# Patient Record
Sex: Female | Born: 1980
Health system: Southern US, Community
[De-identification: ages and names within clinical notes are randomized; demographics above are authoritative.]

## PROBLEM LIST (undated history)

## (undated) DIAGNOSIS — O24419 Gestational diabetes mellitus in pregnancy, unspecified control: Secondary | ICD-10-CM

---

## 2003-05-07 ENCOUNTER — Emergency Department (HOSPITAL_COMMUNITY): Admission: EM | Admit: 2003-05-07 | Discharge: 2003-05-07 | Payer: Self-pay | Admitting: Emergency Medicine

## 2003-05-08 ENCOUNTER — Emergency Department (HOSPITAL_COMMUNITY): Admission: EM | Admit: 2003-05-08 | Discharge: 2003-05-08 | Payer: Self-pay | Admitting: Emergency Medicine

## 2003-12-02 ENCOUNTER — Emergency Department (HOSPITAL_COMMUNITY): Admission: EM | Admit: 2003-12-02 | Discharge: 2003-12-02 | Payer: Self-pay | Admitting: Emergency Medicine

## 2005-11-10 ENCOUNTER — Ambulatory Visit: Payer: Self-pay | Admitting: Internal Medicine

## 2005-12-01 ENCOUNTER — Emergency Department: Payer: Self-pay | Admitting: Unknown Physician Specialty

## 2006-04-09 IMAGING — US ABDOMEN ULTRASOUND
1 series · 17 of 25 positions shown · non-contrast
Comparison: none

REASON FOR EXAM: RUQ PAIN Call Report [id number]
COMMENTS:

[Series 1: abdomen ultrasound · 17 of 45 slices shown]
[im 1/45]
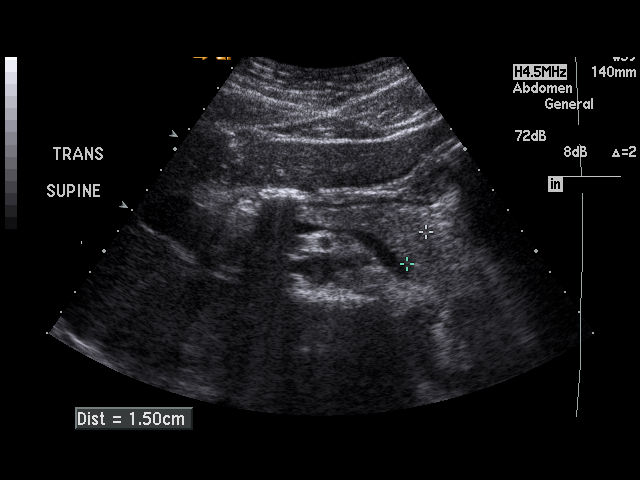
[im 4/45]
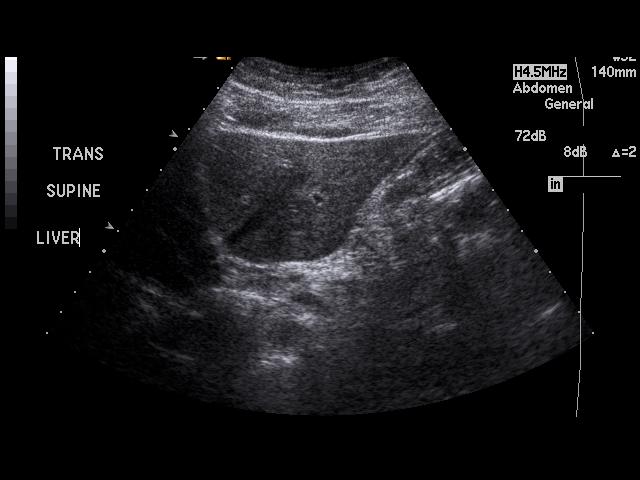
[im 6/45]
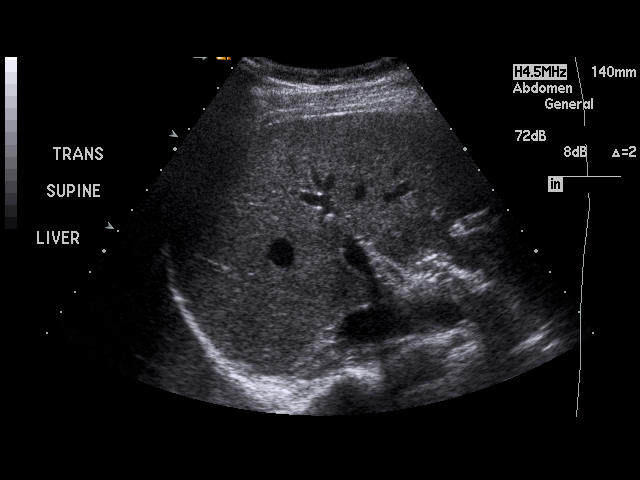
[im 10/45]
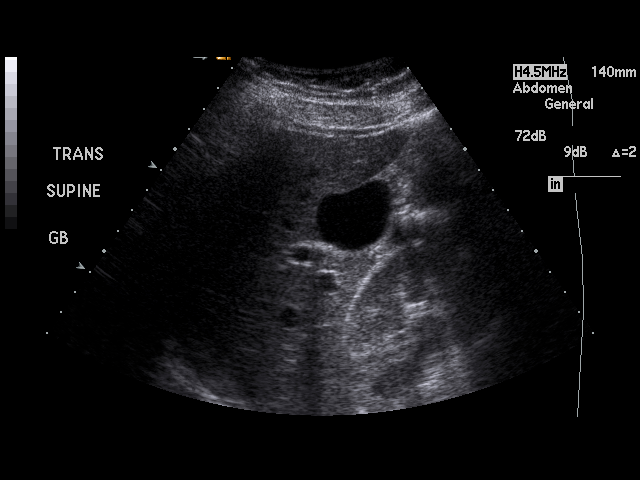
[im 12/45]
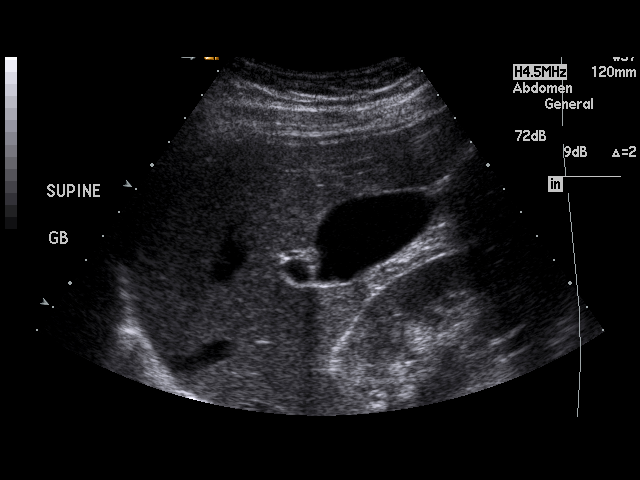
[im 15/45]
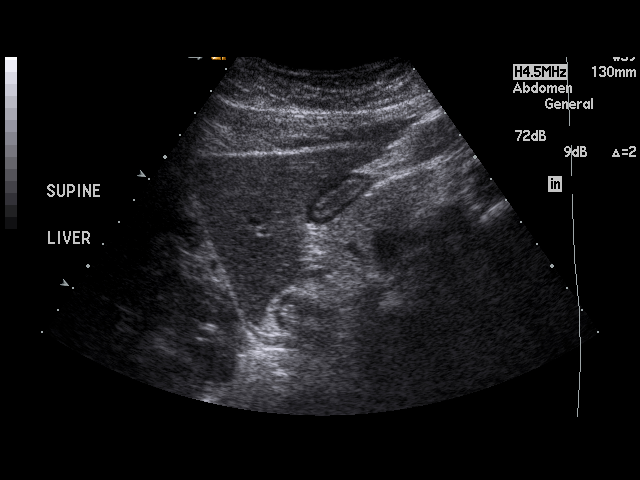
[im 17/45]
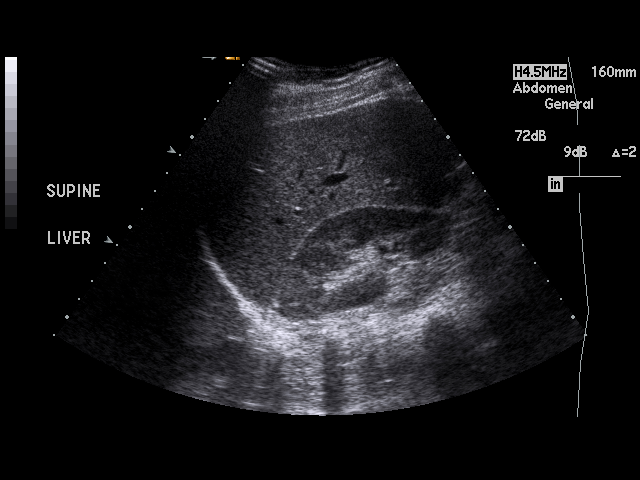
[im 21/45]
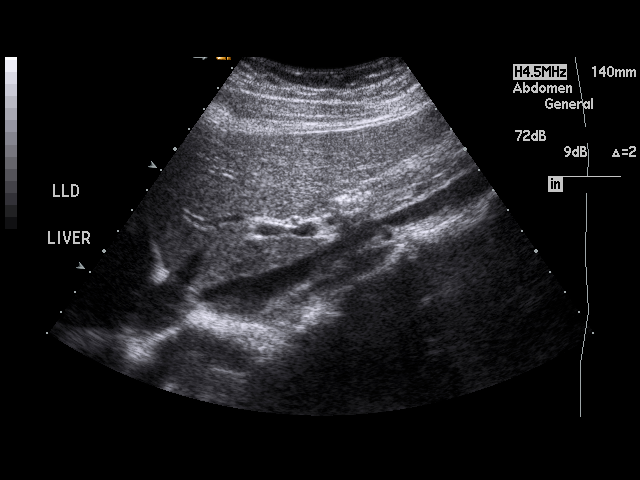
[im 23/45]
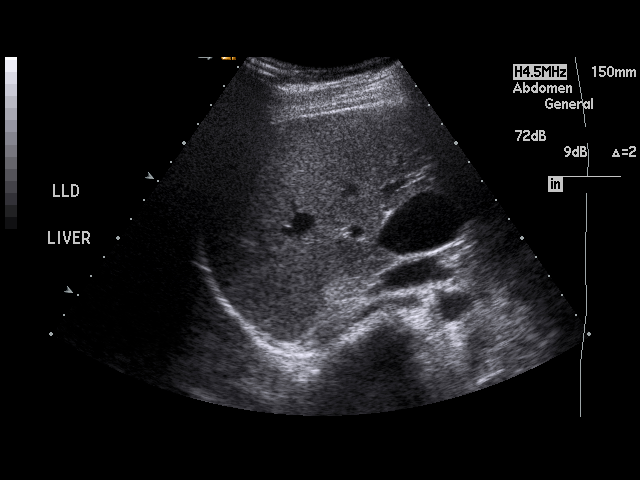
[im 24/45]
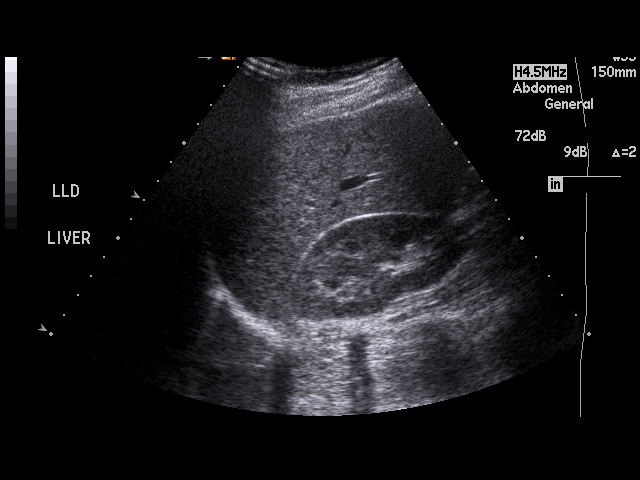
[im 28/45]
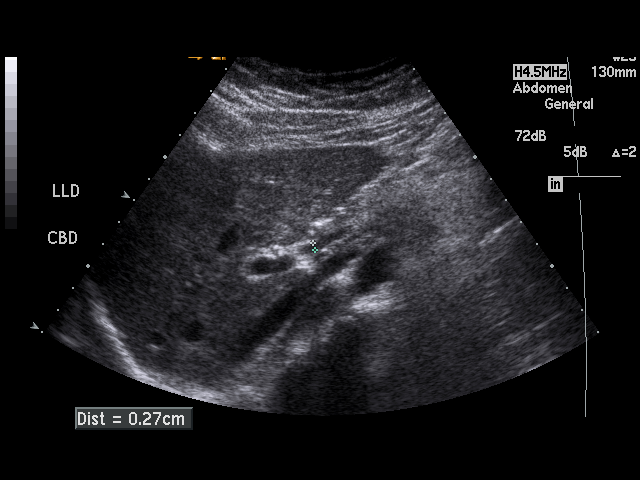
[im 30/45]
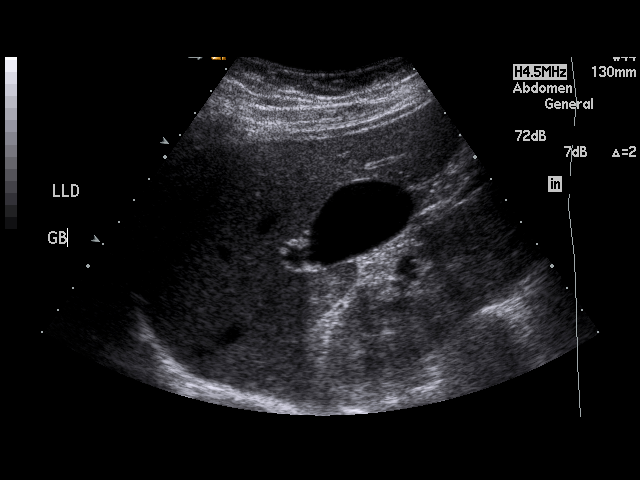
[im 34/45]
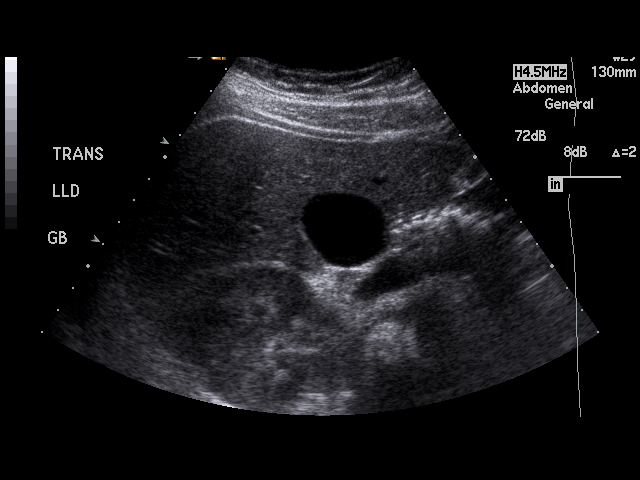
[im 35/45]
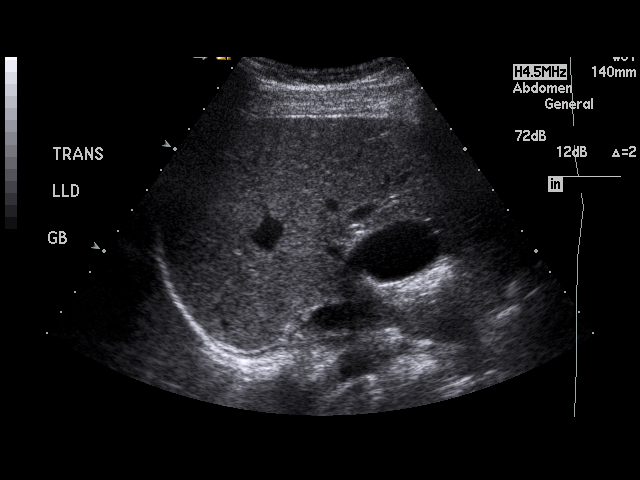
[im 39/45]
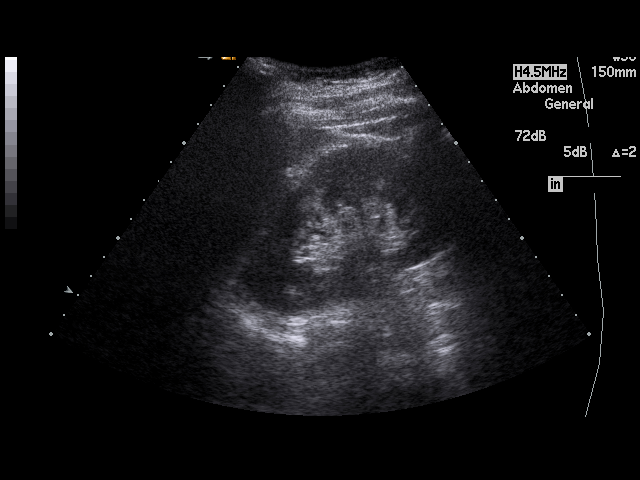
[im 41/45]
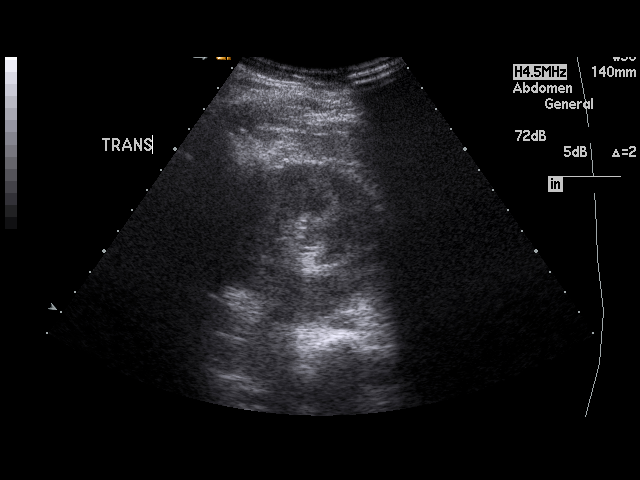
[im 45/45]
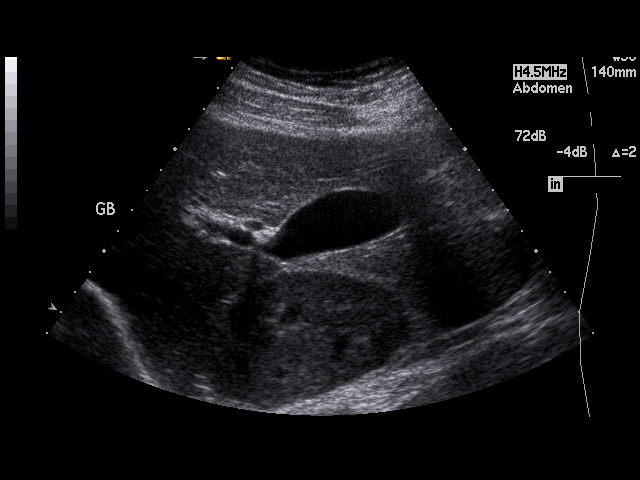

[17 of 25 positions shown; findings below may reference images not displayed]

PROCEDURE:     US  - US ABDOMEN GENERAL SURVEY  - November 10, 2005  [DATE]

RESULT:     The liver, spleen and pancreas are normal in appearance. No
gallstones are seen. There is no thickening of the gallbladder wall. The
common bile duct measures 2.8 mm in diameter, which is within normal limits.
The kidneys show no hydronephrosis. There is no ascites.
IMPRESSION: 1)No significant abnormalities are noted.

## 2009-02-17 ENCOUNTER — Ambulatory Visit: Payer: Self-pay | Admitting: Obstetrics and Gynecology

## 2009-03-18 ENCOUNTER — Observation Stay: Payer: Self-pay

## 2009-03-21 ENCOUNTER — Inpatient Hospital Stay: Payer: Self-pay

## 2013-12-15 ENCOUNTER — Emergency Department: Payer: Self-pay | Admitting: Emergency Medicine

## 2013-12-15 LAB — URINALYSIS, COMPLETE
BILIRUBIN, UR: NEGATIVE
Bacteria: NONE SEEN
GLUCOSE, UR: NEGATIVE mg/dL (ref 0–75)
KETONE: NEGATIVE
LEUKOCYTE ESTERASE: NEGATIVE
NITRITE: NEGATIVE
PH: 6 (ref 4.5–8.0)
Protein: NEGATIVE
RBC,UR: 144 /HPF (ref 0–5)
Specific Gravity: 1.01 (ref 1.003–1.030)

## 2013-12-15 LAB — COMPREHENSIVE METABOLIC PANEL
ALBUMIN: 3.6 g/dL (ref 3.4–5.0)
ALT: 49 U/L (ref 12–78)
AST: 31 U/L (ref 15–37)
Alkaline Phosphatase: 80 U/L
Anion Gap: 2 — ABNORMAL LOW (ref 7–16)
BUN: 7 mg/dL (ref 7–18)
Bilirubin,Total: 0.5 mg/dL (ref 0.2–1.0)
CREATININE: 0.71 mg/dL (ref 0.60–1.30)
Calcium, Total: 8.7 mg/dL (ref 8.5–10.1)
Chloride: 105 mmol/L (ref 98–107)
Co2: 28 mmol/L (ref 21–32)
EGFR (African American): >60
Glucose: 89 mg/dL (ref 65–99)
Osmolality: 268 (ref 275–301)
POTASSIUM: 3.6 mmol/L (ref 3.5–5.1)
Sodium: 135 mmol/L — ABNORMAL LOW (ref 136–145)
Total Protein: 7.8 g/dL (ref 6.4–8.2)

## 2013-12-15 LAB — CBC
HCT: 39 % (ref 35.0–47.0)
HGB: 12.8 g/dL (ref 12.0–16.0)
MCH: 30 pg (ref 26.0–34.0)
MCHC: 32.8 g/dL (ref 32.0–36.0)
MCV: 92 fL (ref 80–100)
PLATELETS: 335 10*3/uL (ref 150–440)
RBC: 4.26 10*6/uL (ref 3.80–5.20)
RDW: 12.9 % (ref 11.5–14.5)
WBC: 9.1 10*3/uL (ref 3.6–11.0)

## 2013-12-15 LAB — HCG, QUANTITATIVE, PREGNANCY: BETA HCG, QUANT.: 465 m[IU]/mL — AB

## 2014-02-04 LAB — OB RESULTS CONSOLE RPR: RPR: NONREACTIVE

## 2014-08-07 LAB — OB RESULTS CONSOLE HIV ANTIBODY (ROUTINE TESTING): HIV: NONREACTIVE

## 2014-08-07 LAB — OB RESULTS CONSOLE PLATELET COUNT: Platelets: 352 10*3/uL

## 2014-08-07 LAB — OB RESULTS CONSOLE GC/CHLAMYDIA
Chlamydia: NEGATIVE
GC PROBE AMP, GENITAL: NEGATIVE

## 2014-08-07 LAB — OB RESULTS CONSOLE RUBELLA ANTIBODY, IGM: RUBELLA: IMMUNE

## 2014-08-07 LAB — OB RESULTS CONSOLE VARICELLA ZOSTER ANTIBODY, IGG: Varicella: IMMUNE

## 2014-08-07 LAB — OB RESULTS CONSOLE HGB/HCT, BLOOD: Hemoglobin: 11.7 g/dL

## 2014-09-05 ENCOUNTER — Ambulatory Visit: Payer: Self-pay

## 2014-09-17 ENCOUNTER — Ambulatory Visit: Payer: Self-pay

## 2014-10-18 NOTE — L&D Delivery Note (Addendum)
Delivery Note At 6:23 PM a viable and healthy female was delivered via Vaginal, Spontaneous Delivery (Presentation: Left Occiput Anterior).  APGAR: 8, 9; weight  .   Placenta status: Intact, Spontaneous.  Cord: 3 vessels with the following complications: None.  Anesthesia: Epidural  Episiotomy: None Lacerations: None Suture Repair: n/a Est. Blood Loss (mL): 300  Boggy lower uterine segment with a firm fundus, manual evacuation of clots x2, 800mcg cytotec given rectally.  Mom to postpartum.  Baby to Couplet care / Skin to Skin.  [name], Shari Mccarthy 02/24/2015, 6:49 PM

## 2014-12-05 LAB — OB RESULTS CONSOLE HGB/HCT, BLOOD: Hemoglobin: 11 g/dL

## 2015-01-31 LAB — OB RESULTS CONSOLE GC/CHLAMYDIA
CHLAMYDIA, DNA PROBE: NEGATIVE
GC PROBE AMP, GENITAL: NEGATIVE

## 2015-01-31 LAB — OB RESULTS CONSOLE GBS: GBS: NEGATIVE

## 2015-02-13 ENCOUNTER — Observation Stay
Admit: 2015-02-13 | Disposition: A | Discharge status: Home / Self Care | Payer: Self-pay | Attending: Obstetrics and Gynecology | Admitting: Obstetrics and Gynecology

## 2015-02-13 LAB — PROTEIN / CREATININE RATIO, URINE
Creatinine, Urine Random: 74 mg/dL (ref 30–125)
PROTEIN/CREAT. RATIO: 135 mg/g{creat} (ref 0–200)
Protein, Urine: 10 mg/dL (ref 0–9)

## 2015-02-13 LAB — PIH PROFILE
Anion Gap: 9 (ref 7–16)
BUN: 11 mg/dL
CO2: 21 mmol/L — AB
Calcium, Total: 8.5 mg/dL — ABNORMAL LOW
Chloride: 106 mmol/L
Creatinine: 0.45 mg/dL
EGFR (Non-African Amer.): >60
GLUCOSE: 96 mg/dL
HCT: 36.2 % (ref 35.0–47.0)
HGB: 12.7 g/dL (ref 12.0–16.0)
MCH: 33.2 pg (ref 26.0–34.0)
MCHC: 35.2 g/dL (ref 32.0–36.0)
MCV: 94 fL (ref 80–100)
PLATELETS: 238 10*3/uL (ref 150–440)
POTASSIUM: 3.9 mmol/L
RBC: 3.84 10*6/uL (ref 3.80–5.20)
RDW: 13.9 % (ref 11.5–14.5)
SGOT(AST): 23 U/L
SODIUM: 136 mmol/L
Uric Acid: 5.1 mg/dL
WBC: 9.3 10*3/uL (ref 3.6–11.0)

## 2015-02-15 ENCOUNTER — Observation Stay
Admit: 2015-02-15 | Disposition: A | Discharge status: Home / Self Care | Payer: Self-pay | Attending: Obstetrics and Gynecology | Admitting: Obstetrics and Gynecology

## 2015-02-23 ENCOUNTER — Inpatient Hospital Stay
Admission: EM | Admit: 2015-02-23 | Discharge: 2015-02-26 | DRG: 774 | Disposition: A | Discharge status: Home / Self Care | Payer: BLUE CROSS/BLUE SHIELD | Attending: Obstetrics and Gynecology | Admitting: Obstetrics and Gynecology

## 2015-02-23 ENCOUNTER — Encounter: Payer: Self-pay | Admitting: *Deleted

## 2015-02-23 DIAGNOSIS — Z3A39 39 weeks gestation of pregnancy: Secondary | ICD-10-CM | POA: Diagnosis present

## 2015-02-23 DIAGNOSIS — O1403 Mild to moderate pre-eclampsia, third trimester: Secondary | ICD-10-CM | POA: Diagnosis present

## 2015-02-23 DIAGNOSIS — O4292 Full-term premature rupture of membranes, unspecified as to length of time between rupture and onset of labor: Principal | ICD-10-CM | POA: Diagnosis present

## 2015-02-23 DIAGNOSIS — O169 Unspecified maternal hypertension, unspecified trimester: Secondary | ICD-10-CM | POA: Diagnosis not present

## 2015-02-23 DIAGNOSIS — O24420 Gestational diabetes mellitus in childbirth, diet controlled: Secondary | ICD-10-CM | POA: Diagnosis present

## 2015-02-23 HISTORY — DX: Gestational diabetes mellitus in pregnancy, unspecified control: O24.419

## 2015-02-24 ENCOUNTER — Encounter: Payer: Self-pay | Admitting: Obstetrics and Gynecology

## 2015-02-24 ENCOUNTER — Inpatient Hospital Stay: Payer: BLUE CROSS/BLUE SHIELD | Admitting: Registered Nurse

## 2015-02-24 ENCOUNTER — Other Ambulatory Visit: Payer: Self-pay | Admitting: Obstetrics and Gynecology

## 2015-02-24 DIAGNOSIS — O24420 Gestational diabetes mellitus in childbirth, diet controlled: Secondary | ICD-10-CM | POA: Diagnosis present

## 2015-02-24 DIAGNOSIS — O1403 Mild to moderate pre-eclampsia, third trimester: Secondary | ICD-10-CM | POA: Diagnosis present

## 2015-02-24 DIAGNOSIS — O169 Unspecified maternal hypertension, unspecified trimester: Secondary | ICD-10-CM | POA: Diagnosis not present

## 2015-02-24 DIAGNOSIS — O4292 Full-term premature rupture of membranes, unspecified as to length of time between rupture and onset of labor: Secondary | ICD-10-CM | POA: Diagnosis present

## 2015-02-24 DIAGNOSIS — Z3A39 39 weeks gestation of pregnancy: Secondary | ICD-10-CM | POA: Diagnosis present

## 2015-02-24 LAB — CBC
HEMATOCRIT: 38.4 % (ref 35.0–47.0)
HEMOGLOBIN: 13.1 g/dL (ref 12.0–16.0)
MCH: 32.6 pg (ref 26.0–34.0)
MCHC: 34.1 g/dL (ref 32.0–36.0)
MCV: 95.6 fL (ref 80.0–100.0)
Platelets: 247 10*3/uL (ref 150–440)
RBC: 4.02 MIL/uL (ref 3.80–5.20)
RDW: 13.7 % (ref 11.5–14.5)
WBC: 9.7 10*3/uL (ref 3.6–11.0)

## 2015-02-24 LAB — COMPREHENSIVE METABOLIC PANEL
ALBUMIN: 2.9 g/dL — AB (ref 3.5–5.0)
ALT: 17 U/L (ref 14–54)
ANION GAP: 9 (ref 5–15)
AST: 30 U/L (ref 15–41)
Alkaline Phosphatase: 207 U/L — ABNORMAL HIGH (ref 38–126)
BILIRUBIN TOTAL: 0.8 mg/dL (ref 0.3–1.2)
BUN: 9 mg/dL (ref 6–20)
CALCIUM: 8.5 mg/dL — AB (ref 8.9–10.3)
CHLORIDE: 106 mmol/L (ref 101–111)
CO2: 22 mmol/L (ref 22–32)
CREATININE: 0.47 mg/dL (ref 0.44–1.00)
Glucose, Bld: 75 mg/dL (ref 65–99)
Potassium: 4.3 mmol/L (ref 3.5–5.1)
Sodium: 137 mmol/L (ref 135–145)
TOTAL PROTEIN: 6.5 g/dL (ref 6.5–8.1)

## 2015-02-24 LAB — ABO/RH: ABO/RH(D): A POS

## 2015-02-24 LAB — URIC ACID: Uric Acid, Serum: 6.5 mg/dL (ref 2.3–6.6)

## 2015-02-24 LAB — TYPE AND SCREEN
ABO/RH(D): A POS
Antibody Screen: NEGATIVE

## 2015-02-24 MED ORDER — LACTATED RINGERS IV SOLN: 500.0000 mL | INTRAVENOUS | Status: DC | PRN

## 2015-02-24 MED ORDER — DIPHENHYDRAMINE HCL 25 MG PO CAPS: 25.0000 mg | ORAL_CAPSULE | Freq: Four times a day (QID) | ORAL | Status: DC | PRN

## 2015-02-24 MED ORDER — DIBUCAINE 1 % RE OINT: 1.0000 "application " | TOPICAL_OINTMENT | RECTAL | Status: DC | PRN

## 2015-02-24 MED ORDER — PRENATAL VITAMINS 0.8 MG PO TABS
1.0000 | ORAL_TABLET | Freq: Every day | ORAL | Status: DC
  Filled 2015-02-24 (×2): qty 1

## 2015-02-24 MED ORDER — OXYTOCIN 10 UNIT/ML IJ SOLN
INTRAMUSCULAR | Status: AC
  Filled 2015-02-24: qty 2

## 2015-02-24 MED ORDER — IBUPROFEN 600 MG PO TABS
ORAL_TABLET | ORAL | Status: AC
  Administered 2015-02-24: 600 mg via ORAL
  Filled 2015-02-24: qty 1

## 2015-02-24 MED ORDER — MEASLES, MUMPS & RUBELLA VAC ~~LOC~~ INJ
0.5000 mL | INJECTION | Freq: Once | SUBCUTANEOUS | Status: DC
  Filled 2015-02-24: qty 0.5

## 2015-02-24 MED ORDER — OXYTOCIN 40 UNITS IN LACTATED RINGERS INFUSION - SIMPLE MED
1.0000 m[IU]/min | INTRAVENOUS | Status: DC
  Administered 2015-02-24: 13 m[IU]/min via INTRAVENOUS
  Administered 2015-02-24: 1 m[IU]/min via INTRAVENOUS
  Administered 2015-02-24: 19 m[IU]/min via INTRAVENOUS

## 2015-02-24 MED ORDER — OXYTOCIN BOLUS FROM INFUSION
500.0000 mL | INTRAVENOUS | Status: DC
  Administered 2015-02-24: 500 mL via INTRAVENOUS

## 2015-02-24 MED ORDER — ACETAMINOPHEN 325 MG PO TABS: 650.0000 mg | ORAL_TABLET | ORAL | Status: DC | PRN

## 2015-02-24 MED ORDER — SENNOSIDES-DOCUSATE SODIUM 8.6-50 MG PO TABS
2.0000 | ORAL_TABLET | ORAL | Status: DC
  Administered 2015-02-25 – 2015-02-26 (×2): 2 via ORAL
  Filled 2015-02-24 (×4): qty 2

## 2015-02-24 MED ORDER — FENTANYL 2.5 MCG/ML W/ROPIVACAINE 0.2% IN NS 100 ML EPIDURAL INFUSION (ARMC-ANES)
EPIDURAL | Status: AC
  Administered 2015-02-24: 250 ug
  Filled 2015-02-24: qty 100

## 2015-02-24 MED ORDER — SODIUM CHLORIDE 0.9 % IV SOLN: 250.0000 mL | INTRAVENOUS | Status: DC | PRN

## 2015-02-24 MED ORDER — BUPIVACAINE HCL (PF) 0.25 % IJ SOLN
INTRAMUSCULAR | Status: DC | PRN
  Administered 2015-02-24 (×2): 4 mL

## 2015-02-24 MED ORDER — WITCH HAZEL-GLYCERIN EX PADS: 1.0000 "application " | MEDICATED_PAD | CUTANEOUS | Status: DC | PRN

## 2015-02-24 MED ORDER — SODIUM CHLORIDE 0.9 % IJ SOLN: 3.0000 mL | Freq: Two times a day (BID) | INTRAMUSCULAR | Status: DC

## 2015-02-24 MED ORDER — SIMETHICONE 80 MG PO CHEW
80.0000 mg | CHEWABLE_TABLET | ORAL | Status: DC | PRN
  Filled 2015-02-24: qty 1

## 2015-02-24 MED ORDER — MISOPROSTOL 200 MCG PO TABS
ORAL_TABLET | ORAL | Status: AC
  Filled 2015-02-24: qty 4

## 2015-02-24 MED ORDER — LACTATED RINGERS IV SOLN
INTRAVENOUS | Status: DC
  Administered 2015-02-24: 125 mL/h via INTRAUTERINE

## 2015-02-24 MED ORDER — EPHEDRINE 5 MG/ML INJ
10.0000 mg | INTRAVENOUS | Status: DC | PRN
  Filled 2015-02-24: qty 2

## 2015-02-24 MED ORDER — BENZOCAINE-MENTHOL 20-0.5 % EX AERO: 1.0000 "application " | INHALATION_SPRAY | CUTANEOUS | Status: DC | PRN

## 2015-02-24 MED ORDER — TERBUTALINE SULFATE 1 MG/ML IJ SOLN
0.2500 mg | Freq: Once | INTRAMUSCULAR | Status: AC | PRN
  Administered 2015-02-24: 0.25 mg via SUBCUTANEOUS

## 2015-02-24 MED ORDER — ONDANSETRON HCL 4 MG/2ML IJ SOLN: 4.0000 mg | INTRAMUSCULAR | Status: DC | PRN

## 2015-02-24 MED ORDER — LACTATED RINGERS IV SOLN
INTRAVENOUS | Status: DC
  Administered 2015-02-24 (×3): via INTRAVENOUS
  Administered 2015-02-24 (×2): 125 mL/h via INTRAVENOUS

## 2015-02-24 MED ORDER — LANOLIN HYDROUS EX OINT: TOPICAL_OINTMENT | CUTANEOUS | Status: DC | PRN

## 2015-02-24 MED ORDER — OXYCODONE-ACETAMINOPHEN 5-325 MG PO TABS
1.0000 | ORAL_TABLET | ORAL | Status: DC | PRN
  Administered 2015-02-25: 1 via ORAL
  Filled 2015-02-24: qty 1

## 2015-02-24 MED ORDER — PHENYLEPHRINE 40 MCG/ML (10ML) SYRINGE FOR IV PUSH (FOR BLOOD PRESSURE SUPPORT)
80.0000 ug | PREFILLED_SYRINGE | INTRAVENOUS | Status: DC | PRN
  Filled 2015-02-24: qty 2

## 2015-02-24 MED ORDER — OXYCODONE-ACETAMINOPHEN 5-325 MG PO TABS: 1.0000 | ORAL_TABLET | ORAL | Status: DC | PRN

## 2015-02-24 MED ORDER — OXYCODONE-ACETAMINOPHEN 5-325 MG PO TABS: 2.0000 | ORAL_TABLET | ORAL | Status: DC | PRN

## 2015-02-24 MED ORDER — CITRIC ACID-SODIUM CITRATE 334-500 MG/5ML PO SOLN: 30.0000 mL | ORAL | Status: DC | PRN

## 2015-02-24 MED ORDER — BUTORPHANOL TARTRATE 1 MG/ML IJ SOLN: 1.0000 mg | INTRAMUSCULAR | Status: DC | PRN

## 2015-02-24 MED ORDER — LIDOCAINE HCL (PF) 1 % IJ SOLN: INTRAMUSCULAR | Status: DC | PRN

## 2015-02-24 MED ORDER — LIDOCAINE HCL (PF) 1 % IJ SOLN
INTRAMUSCULAR | Status: AC
  Filled 2015-02-24: qty 30

## 2015-02-24 MED ORDER — LIDOCAINE-EPINEPHRINE (PF) 1.5 %-1:200000 IJ SOLN
INTRAMUSCULAR | Status: DC | PRN
  Administered 2015-02-24: 3 mL

## 2015-02-24 MED ORDER — ONDANSETRON HCL 4 MG/2ML IJ SOLN: 4.0000 mg | Freq: Four times a day (QID) | INTRAMUSCULAR | Status: DC | PRN

## 2015-02-24 MED ORDER — DIPHENHYDRAMINE HCL 50 MG/ML IJ SOLN: 12.5000 mg | INTRAMUSCULAR | Status: DC | PRN

## 2015-02-24 MED ORDER — LIDOCAINE HCL (PF) 1 % IJ SOLN: 30.0000 mL | INTRAMUSCULAR | Status: DC | PRN

## 2015-02-24 MED ORDER — FENTANYL 2.5 MCG/ML W/ROPIVACAINE 0.2% IN NS 100 ML EPIDURAL INFUSION (ARMC-ANES): 9.0000 mL/h | EPIDURAL | Status: DC

## 2015-02-24 MED ORDER — FLEET ENEMA 7-19 GM/118ML RE ENEM: 1.0000 | ENEMA | Freq: Every day | RECTAL | Status: DC | PRN

## 2015-02-24 MED ORDER — TETANUS-DIPHTH-ACELL PERTUSSIS 5-2.5-18.5 LF-MCG/0.5 IM SUSP
0.5000 mL | Freq: Once | INTRAMUSCULAR | Status: DC
  Filled 2015-02-24: qty 0.5

## 2015-02-24 MED ORDER — OXYTOCIN 40 UNITS IN LACTATED RINGERS INFUSION - SIMPLE MED
INTRAVENOUS | Status: AC
  Filled 2015-02-24: qty 1000

## 2015-02-24 MED ORDER — TERBUTALINE SULFATE 1 MG/ML IJ SOLN
INTRAMUSCULAR | Status: AC
  Administered 2015-02-24: 0.25 mg via SUBCUTANEOUS
  Filled 2015-02-24: qty 1

## 2015-02-24 MED ORDER — IBUPROFEN 600 MG PO TABS
600.0000 mg | ORAL_TABLET | Freq: Four times a day (QID) | ORAL | Status: DC
  Administered 2015-02-24 – 2015-02-26 (×7): 600 mg via ORAL
  Filled 2015-02-24 (×6): qty 1

## 2015-02-24 MED ORDER — AMMONIA AROMATIC IN INHA
RESPIRATORY_TRACT | Status: AC
  Filled 2015-02-24: qty 10

## 2015-02-24 MED ORDER — LACTATED RINGERS IV BOLUS (SEPSIS)
350.0000 mL | Freq: Once | INTRAVENOUS | Status: AC
  Administered 2015-02-24: 350 mL via INTRAVENOUS

## 2015-02-24 MED ORDER — ONDANSETRON HCL 4 MG PO TABS
4.0000 mg | ORAL_TABLET | ORAL | Status: DC | PRN
  Filled 2015-02-24: qty 1

## 2015-02-24 MED ORDER — OXYTOCIN 40 UNITS IN LACTATED RINGERS INFUSION - SIMPLE MED: 62.5000 mL/h | INTRAVENOUS | Status: DC | PRN

## 2015-02-24 MED ORDER — BISACODYL 10 MG RE SUPP: 10.0000 mg | Freq: Every day | RECTAL | Status: DC | PRN

## 2015-02-24 MED ORDER — OXYTOCIN 40 UNITS IN LACTATED RINGERS INFUSION - SIMPLE MED: 62.5000 mL/h | INTRAVENOUS | Status: DC

## 2015-02-24 MED ORDER — MISOPROSTOL 200 MCG PO TABS
800.0000 ug | ORAL_TABLET | Freq: Once | ORAL | Status: AC
  Administered 2015-02-24: 800 ug via RECTAL

## 2015-02-24 MED ORDER — SODIUM CHLORIDE 0.9 % IJ SOLN: 3.0000 mL | INTRAMUSCULAR | Status: DC | PRN

## 2015-02-24 MED ORDER — FENTANYL 2.5 MCG/ML W/ROPIVACAINE 0.2% IN NS 100 ML EPIDURAL INFUSION (ARMC-ANES)
EPIDURAL | Status: DC | PRN
  Administered 2015-02-24: 9 mL/h via EPIDURAL

## 2015-02-24 NOTE — Progress Notes (Signed)
End of shift report given to [employer] Rxliz sedgwick rn

## 2015-02-24 NOTE — Anesthesia Procedure Notes (Signed)
Epidural Patient location during procedure: OB Start time: 02/24/2015 10:32 AM End time: 02/24/2015 10:42 AM  Staffing Resident/CRNA: Stormy FabianURTIS, Zaid Tomes  Preanesthetic Checklist Completed: patient identified, site marked, surgical consent, pre-op evaluation, timeout performed, IV checked, risks and benefits discussed and monitors and equipment checked  Epidural Patient position: sitting Prep: Betadine Patient monitoring: heart rate, continuous pulse ox and blood pressure Approach: midline Location: L4-L5 Injection technique: LOR air  Needle:  Needle type: Tuohy  Needle gauge: 18 G Needle length: 9 cm and 9 Needle insertion depth: 7 cm Catheter type: closed end flexible Catheter size: 20 Guage Catheter at skin depth: 11 cm Test dose: negative and 1.5% lidocaine with Epi 1:200 K  Assessment Sensory level: T10 Events: blood not aspirated, injection not painful, no injection resistance, negative IV test and no paresthesia  Additional Notes Pt's history reviewed and consent obtained as per OB consent Patient tolerated the insertion well without complications. Negative SATD, negative IVTD All VSS were obtained and monitored through OBIX and nursing protocols followed.Reason for block:procedure for pain

## 2015-02-24 NOTE — Progress Notes (Deleted)
Delivery Summary for Shari Mccarthy  Labor Events:   Preterm labor:   Rupture date:   Rupture time:   Rupture type: Spontaneous  Fluid Color: Clear  Induction:   Augmentation:   Complications:   Cervical ripening:          Delivery:   Episiotomy:   Lacerations:   Repair suture:   Repair # of packets:   Blood loss (ml):    This patient has no babies on file.

## 2015-02-24 NOTE — Progress Notes (Signed)
S: Pt experienced prolonged decel to 90s with tachysystole x6 minutes. Patient repositioned, O2 applied, fluid bolus, pitocin held. FSE applied. Hands and knees  O: Filed Vitals:   02/24/15 1220 02/24/15 1232 02/24/15 1332 02/24/15 1432  BP:  135/75 126/82 143/83  Pulse:  74 79 76  Temp:   98.3 F (36.8 C)   TempSrc:   Oral   Resp:      Height:      Weight:      SpO2: 100%        Gen: NAD, AAOx3      Abd: FNTTP      Ext: Non-tender, +edema    FHT:  mod var + accelerations, + decelerations as above TOCO: Q3 min SVE: 8/8/-1, edematous lip on right Pitocin ta 17   A/P:  34 y.o. yo [id number] at 3169w5d for iol/PROM with prolonged decel. Good resolution with return to baseline. Will monitor for 30 min of good strip prior to starting titration of pitocin again.  [name], Tariq Pernell 2:49 PM

## 2015-02-24 NOTE — Progress Notes (Signed)
S: Resting comfortably with epidural. + CTX, no LOF, VB O: Filed Vitals:   02/24/15 1541 02/24/15 1609 02/24/15 1632 02/24/15 1716  BP:   138/80   Pulse:   84   Temp: 99.6 F (37.6 C) 100.1 F (37.8 C)  99.1 F (37.3 C)  TempSrc: Oral Oral  Oral  Resp:      Height:      Weight:      SpO2:         Gen: NAD, AAOx3      Abd: FNTTP      Ext: Non-tender,     FHT:  mod var + accelerations no decelerations TOCO: Q3 min SVE: 10/100/+1   A/P:  34 y.o. yo [id number] at 1880w5d for iol/PROM, now fully dilated.   FWB: Reassuring Cat 1 tracing. EFW 3700  Anticipate vaginal delivery. Will begin pushing.  Elevated BP in mild range. Labs all within normal limits.   Fetal strip reassuring  Contraception: LARCs vs permanent sterilization   Shari Mccarthy 5:56 PM

## 2015-02-24 NOTE — Anesthesia Preprocedure Evaluation (Signed)
Anesthesia Evaluation  Patient identified by MRN, date of birth, ID band Patient awake    Reviewed: Allergy & Precautions, H&P , NPO status , Patient's Chart, lab work & pertinent test results  Airway Mallampati: II  TM Distance: >3 FB Neck ROM: full    Dental no notable dental hx. (+) Teeth Intact   Pulmonary neg pulmonary ROS, former smoker,  Quit smoking 6 years ago   Pulmonary exam normal       Cardiovascular negative cardio ROS Normal cardiovascular exam    Neuro/Psych negative neurological ROS  negative psych ROS   GI/Hepatic negative GI ROS, Neg liver ROS,   Endo/Other  diabetes  Renal/GU   negative genitourinary   Musculoskeletal   Abdominal   Peds  Hematology negative hematology ROS (+)   Anesthesia Other Findings   Reproductive/Obstetrics (+) Pregnancy                             Anesthesia Physical Anesthesia Plan  ASA: II  Anesthesia Plan: Epidural   Post-op Pain Management:    Induction:   Airway Management Planned:   Additional Equipment:   Intra-op Plan:   Post-operative Plan:   Informed Consent: I have reviewed the patients History and Physical, chart, labs and discussed the procedure including the risks, benefits and alternatives for the proposed anesthesia with the patient or authorized representative who has indicated his/her understanding and acceptance.     Plan Discussed with: Anesthesiologist  Anesthesia Plan Comments:         Anesthesia Quick Evaluation

## 2015-02-24 NOTE — H&P (Addendum)
Shari Mccarthy is a 34 y.o. female presenting with rupture of membranes at 6:00 pm, good fetal movement . Maternal Medical History:  Reason for admission: Rupture of membranes.  Nausea.  Contractions: Frequency: irregular.   Perceived severity is mild.    Fetal activity: Perceived fetal activity is normal.   Last perceived fetal movement was within the past hour.    Prenatal Complications - Diabetes: gestational. Diabetes is managed by diet.      OB History    Gravida Para Term Preterm AB TAB SAB Ectopic Multiple Living   4 2 2  0 1 0 1 0 0 2     History reviewed. No pertinent past medical history. History reviewed. No pertinent past surgical history. Family History: family history is not on file. Social History:  has no tobacco, alcohol, and drug history on file.   Prenatal Transfer Tool  Maternal Diabetes: Yes:  Diabetes Type:  Diet controlled Genetic Screening: Declined Maternal Ultrasounds/Referrals: Normal Fetal Ultrasounds or other Referrals:  None Maternal Substance Abuse:  No Significant Maternal Medications:  Meds include: Other:  Significant Maternal Lab Results:  Lab values include: Group B Strep negative, Other: RH positive Other Comments:  Desires breastfeeding  Last u/s at 36wks- EFW 48%, fundal placenta  Review of Systems  Constitutional: Negative for fever and chills.  Eyes: Negative for blurred vision and double vision.  Respiratory: Negative for shortness of breath.   Cardiovascular: Negative for chest pain and palpitations.  Gastrointestinal: Positive for heartburn. Negative for nausea, vomiting, abdominal pain, diarrhea and constipation.  Genitourinary: Negative for dysuria, urgency, frequency and flank pain.  Neurological: Negative for headaches.  Psychiatric/Behavioral: Negative for depression.    Dilation: 6 Effacement (%): 70 Station: -1 Exam by:: B.Vale Peraza, MD Blood pressure 137/86, pulse 90, temperature 98.6 F (37 C), temperature  source Oral, resp. rate 18, height 5' (1.524 m), weight 88.451 kg (195 lb). Maternal Exam:  Uterine Assessment: Contraction strength is mild.  Abdomen: Patient reports no abdominal tenderness. Estimated fetal weight is 3700.   Fetal presentation: vertex  Introitus: Normal vulva. Vagina is positive for vaginal discharge.  Ferning test: positive.  Nitrazine test: positive. Amniotic fluid character: clear.  Pelvis: adequate for delivery.   Cervix: Cervix evaluated by sterile speculum exam and digital exam.     Fetal Exam Fetal Monitor Review: Mode: ultrasound.   Baseline rate: 140.  Variability: moderate (6-25 bpm).   Pattern: accelerations present and no decelerations.    Fetal State Assessment: Category I - tracings are normal.     Physical Exam  Constitutional: She is oriented to person, place, and time. She appears well-developed and well-nourished. No distress.  Eyes: No scleral icterus.  Neck: Normal range of motion. Neck supple.  Cardiovascular: Normal rate.   Respiratory: Effort normal. No respiratory distress.  GI: Soft. She exhibits no distension. There is no tenderness.  Genitourinary: Uterus normal. Vaginal discharge found.  Clear fluid from cervical os, +pooling SVE: 6/70/-1, posterior, soft  Musculoskeletal: Normal range of motion.  Neurological: She is alert and oriented to person, place, and time.  Skin: Skin is warm and dry.  Psychiatric: She has a normal mood and affect.    Prenatal labs: ABO, Rh:   A positive Antibody:  negative Rubella:  immune RPR:   neg HBsAg:   neg HIV:   neg GBS:   neg  Assessment/Plan: Induction of labor for PROM with advanced cervical dilation and reassuring testing. GBS neg Breastfeeding  Begin pitocin   Shari Mccarthy  02/24/2015, 12:38 AM

## 2015-02-24 NOTE — Progress Notes (Signed)
Shari CrockerMandy D Mccarthy is a 34 y.o. [id number] at 7970w5d by 11wkultrasound admitted for induction of labor due to Spontaneous rupture of BOW.  Subjective: Pt feeling contractions but no cervical change over last several hours  Objective: BP 132/89 mmHg  Pulse 84  Temp(Src) 98.1 F (36.7 C) (Oral)  Resp 14  Ht 5' (1.524 m)  Wt 88.451 kg (195 lb)  BMI 38.08 kg/m2      FHT:  FHR: 135 bpm, variability: moderate,  accelerations:  Present,  decelerations:  Absent UC:   irregular, every 4-10 minutes SVE:   Dilation: 6 Effacement (%): 80 Station: -1 Exam by:: Eugene GarnetE. Sedgwick, RN  Labs: Lab Results  Component Value Date   WBC 9.7 02/24/2015   HGB 13.1 02/24/2015   HCT 38.4 02/24/2015   MCV 95.6 02/24/2015   PLT 247 02/24/2015    Assessment / Plan: Induction of labor due to PROM,  progressing well on pitocin  Labor: titrate pitocin Preeclampsia:  elevated BP to mild range x1 but no other s/s of PreE; labs within Fetal Wellbeing:  Category I Pain Control:  epidural when desired I/D:  n/a Anticipated MOD:  NSVD  Shari Mccarthy 02/24/2015, 8:16 AM

## 2015-02-25 LAB — RPR: RPR: NONREACTIVE

## 2015-02-25 MED ORDER — PRENATAL PLUS 27-1 MG PO TABS
ORAL_TABLET | ORAL | Status: AC
  Administered 2015-02-25: 1
  Filled 2015-02-25: qty 1

## 2015-02-25 NOTE — Progress Notes (Signed)
Post Partum Day 1 Subjective: no complaints, up ad lib, voiding, tolerating PO and + flatus  Objective: Blood pressure 126/82, pulse 83, temperature 97.9 F (36.6 C), temperature source Oral, resp. rate 20, height 5' (1.524 m), weight 88.451 kg (195 lb), SpO2 98 %, unknown if currently breastfeeding.  Physical Exam:  General: alert, cooperative and no distress Lochia: appropriate Uterine Fundus: firm DVT Evaluation: No evidence of DVT seen on physical exam. No cords or calf tenderness. Continued foot edema, but no ankle or calf edema.   Recent Labs  02/24/15 0038  HGB 13.1  HCT 38.4  WBC 9.7  PLT 247    Assessment/Plan: Plan for discharge tomorrow, Breastfeeding and Lactation consult   LOS: 1 day   Shari Mccarthy 02/25/2015, 10:09 AM

## 2015-02-25 NOTE — H&P (Signed)
L&D Evaluation:  History:  HPI 34 yo [id number] with LMP of 05/14/14 and EDD of 02/26/15 based on US at 11 wks here for BP 130/90 and HA today on frontal and top of head. No blurred visoni, no RUQ pain or hx of proteinuria. Cx is 3-4/80/vtx-2. PNC significant for A1GDM very well controlled with glucose fastings at 90 and 115 range of 2 h pp. Pt is also Obese. Labs: GBD neg, GC/CH neg, A pos, AB neg, Pap neg, HIV/HepB neg, declined aneuploidy testing, Hgb 11. 1 h GCT 151. Here for serial BP and prot/creat ratio.   Presents with BP elevated   Patient's Medical History A1DM, Obesity,   Patient's Surgical History none   Medications Pre Natal Vitamins   Allergies NKDA   Social History none   Family History Non-Contributory   ROS:  ROS All systems were reviewed.  HEENT, CNS, GI, GU, Respiratory, CV, Renal and Musculoskeletal systems were found to be normal.   Exam:  Vital Signs stable  130/90 to 141/95   General no apparent distress   Mental Status clear   Chest clear   Heart normal sinus rhythm, no murmur/gallop/rubs   Estimated Fetal Weight Average for gestational age   Fetal Position vtx   Back no CVAT   Edema 1+   Reflexes 1+   Clonus negative   Pelvic 3-4/80/vtx-2   Mebranes Intact   FHT normal rate with no decels, reactive NST   Ucx irregular   Skin dry   Lymph no lymphadenopathy   Impression:  Impression IUP at 38 1/7 weeks with GDM and elevated BP   Plan:  Plan Awaiting Prot/creat ratio   Electronic Signatures: Sharee PimpleJones, Didi Ganaway W (CNM)  (Signed 28-Apr-16 19:40)  Authored: L&D Evaluation   Last Updated: 28-Apr-16 19:40 by Sharee PimpleJones, Wannetta Langland W (CNM)

## 2015-02-26 LAB — CBC
HCT: 36.5 % (ref 35.0–47.0)
Hemoglobin: 12.4 g/dL (ref 12.0–16.0)
MCH: 32.5 pg (ref 26.0–34.0)
MCHC: 34.1 g/dL (ref 32.0–36.0)
MCV: 95.5 fL (ref 80.0–100.0)
PLATELETS: 230 10*3/uL (ref 150–440)
RBC: 3.83 MIL/uL (ref 3.80–5.20)
RDW: 14.1 % (ref 11.5–14.5)
WBC: 8.7 10*3/uL (ref 3.6–11.0)

## 2015-02-26 MED ORDER — IBUPROFEN 600 MG PO TABS: 600.0000 mg | ORAL_TABLET | Freq: Four times a day (QID) | ORAL | Status: AC

## 2015-02-26 MED ORDER — WITCH HAZEL-GLYCERIN EX PADS: 1.0000 "application " | MEDICATED_PAD | CUTANEOUS | Status: DC | PRN

## 2015-02-26 MED ORDER — BENZOCAINE-MENTHOL 20-0.5 % EX AERO: 1.0000 "application " | INHALATION_SPRAY | CUTANEOUS | Status: DC | PRN

## 2015-02-26 MED ORDER — NORETHINDRONE 0.35 MG PO TABS: 1.0000 | ORAL_TABLET | Freq: Every day | ORAL | Status: DC

## 2015-02-26 MED ORDER — LANOLIN HYDROUS EX OINT: 1.0000 "application " | TOPICAL_OINTMENT | CUTANEOUS | Status: DC | PRN

## 2015-02-26 MED ORDER — CALCIUM CARBONATE ANTACID 500 MG PO CHEW: 1.0000 | CHEWABLE_TABLET | Freq: Every day | ORAL | Status: AC

## 2015-02-26 NOTE — Anesthesia Postprocedure Evaluation (Signed)
  Anesthesia Post-op Note  Patient: Shari CrockerMandy D Abrell  Procedure(s) Performed: * No procedures listed *  Anesthesia type:Epidural  Patient location: 337  Post pain: Pain level controlled  Post assessment: Post-op Vital signs reviewed, Patient's Cardiovascular Status Stable, Respiratory Function Stable, Patent Airway and No signs of Nausea or vomiting  Post vital signs: Reviewed and stable  Last Vitals:  Filed Vitals:   02/26/15 0741  BP: 123/78  Pulse: 96  Temp: 36.7 C  Resp: 20    Level of consciousness: awake, alert  and patient cooperative  Complications: No apparent anesthesia complications

## 2015-02-26 NOTE — Discharge Summary (Addendum)
Obstetric Discharge Summary Reason for Admission: onset of labor with PROM at home Prenatal Procedures: ultrasound, NST Intrapartum Procedures: spontaneous vaginal delivery Postpartum Procedures: none Complications-Operative and Postpartum: none HEMOGLOBIN  Date Value Ref Range Status  02/24/2015 13.1 12.0 - 16.0 g/dL Final  16/10/960402/18/2016 54.011.0 g/dL Final   HGB  Date Value Ref Range Status  02/13/2015 12.7 12.0-16.0 g/dL Final   HCT  Date Value Ref Range Status  02/24/2015 38.4 35.0 - 47.0 % Final  02/13/2015 36.2 35.0-47.0 % Final    Physical Exam:  General: alert Lochia: appropriate Uterine Fundus: firm Incision: healing well DVT Evaluation: No evidence of DVT seen on physical exam.  Discharge Diagnoses: Term Pregnancy-delivered  Discharge Information: Date: 02/26/2015 Activity: unrestricted - nothing in vagina x6 weeks Diet: routine Medications: None Condition: stable Instructions: FU at 6 weeks Discharge to: home   Newborn Data: Live born female  Birth Weight: 7 lb 9.7 oz (3450 g) APGAR: 8, 9  Home with mother.  Milon ScoreJONES, CARON W 02/26/2015, 8:21 AM

## 2015-02-26 NOTE — Discharge Instructions (Signed)
Care After Vaginal Delivery °Congratulations on your new baby!! ° °Refer to this sheet in the next few weeks. These discharge instructions provide you with information on caring for yourself after delivery. Your caregiver may also give you specific instructions. Your treatment has been planned according to the most current medical practices available, but problems sometimes occur. Call your caregiver if you have any problems or questions after you go home. ° °HOME CARE INSTRUCTIONS °· Take over-the-counter or prescription medicines only as directed by your caregiver or pharmacist. °· Do not drink alcohol, especially if you are breastfeeding or taking medicine to relieve pain. °· Do not chew or smoke tobacco. °· Do not use illegal drugs. °· Continue to use good perineal care. Good perineal care includes: °¨ Wiping your perineum from front to back. °¨ Keeping your perineum clean. °· Do not use tampons or douche until your caregiver says it is okay. °· Shower, wash your hair, and take tub baths as directed by your caregiver. °· Wear a well-fitting bra that provides breast support. °· Eat healthy foods. °· Drink enough fluids to keep your urine clear or pale yellow. °· Eat high-fiber foods such as whole grain cereals and breads, brown rice, beans, and fresh fruits and vegetables every day. These foods may help prevent or relieve constipation. °· Follow your caregiver's recommendations regarding resumption of activities such as climbing stairs, driving, lifting, exercising, or traveling. Specifically, no driving for two weeks, so that you are comfortable reacting quickly in an emergency. °· Talk to your caregiver about resuming sexual activities. Resumption of sexual activities is dependent upon your risk of infection, your rate of healing, and your comfort and desire to resume sexual activity. Usually we recommend waiting about six weeks, or until your bleeding stops and you are interested in sex. °· Try to have someone  help you with your household activities and your newborn for at least a few days after you leave the hospital. Even longer is better. °· Rest as much as possible. Try to rest or take a nap when your newborn is sleeping. Sleep deprivation can be very hard after delivery. °· Increase your activities gradually. °· Keep all of your scheduled postpartum appointments. It is very important to keep your scheduled follow-up appointments. At these appointments, your caregiver will be checking to make sure that you are healing physically and emotionally. ° °SEEK MEDICAL CARE IF:  °· You are passing large clots from your vagina.  °· You have a foul smelling discharge from your vagina. °· You have trouble urinating. °· You are urinating frequently. °· You have pain when you urinate. °· You have a change in your bowel movements. °· You have increasing redness, pain, or swelling near your vaginal incision (episiotomy) or vaginal tear. °· You have pus draining from your episiotomy or vaginal tear. °· Your episiotomy or vaginal tear is separating. °· You have painful, hard, or reddened breasts. °· You have a severe headache. °· You have blurred vision or see spots. °· You feel sad or depressed. °· You have thoughts of hurting yourself or your newborn. °· You have questions about your care, the care of your newborn, or medicines. °· You are dizzy or light-headed. °· You have a rash. °· You have nausea or vomiting. °· You were breastfeeding and have not had a menstrual period within 12 weeks after you stopped breastfeeding. °· You are not breastfeeding and have not had a menstrual period by the 12th week after delivery. °· You   have a fever. ° °SEEK IMMEDIATE MEDICAL CARE IF:  °· You have persistent pain. °· You have chest pain. °· You have shortness of breath. °· You faint. °· You have leg pain. °· You have stomach pain. °· Your vaginal bleeding saturates two or more sanitary pads in 1 hour. ° °MAKE SURE YOU:  °· Understand these  instructions. °· Will get help right away if you are not doing well or get worse. °·  °Document Released: 10/01/2000 Document Revised: 02/18/2014 Document Reviewed: 05/31/2012 ° °[employer]® Patient Information ©2015 [employer]. This information is not intended to replace advice given to you by your health care provider. Make sure you discuss any questions you have with your health care provider. ° °

## 2015-02-26 NOTE — Progress Notes (Signed)
:   C/O "feeling well today".  O: VSS.  Breastfeeding infant well. FF. Lochia mod.  Perineum is healing. Neg Homan's sign. Voiding well. Taking po well. H&H stable A: PPD#2 Stable P:  DC today.

## 2015-04-02 LAB — HIV ANTIBODY (ROUTINE TESTING W REFLEX): HIV SCREEN 4TH GENERATION: UNDETERMINED

## 2018-04-14 ENCOUNTER — Ambulatory Visit: Payer: 59 | Admitting: Podiatry

## 2018-04-28 ENCOUNTER — Encounter: Payer: 59 | Admitting: Podiatry

## 2018-05-10 NOTE — Progress Notes (Signed)
This encounter was created in error - please disregard.

## 2019-03-27 DIAGNOSIS — Z01419 Encounter for gynecological examination (general) (routine) without abnormal findings: Secondary | ICD-10-CM | POA: Diagnosis not present

## 2019-03-27 DIAGNOSIS — Z0000 Encounter for general adult medical examination without abnormal findings: Secondary | ICD-10-CM | POA: Diagnosis not present

## 2019-03-27 DIAGNOSIS — N898 Other specified noninflammatory disorders of vagina: Secondary | ICD-10-CM | POA: Diagnosis not present

## 2019-05-09 DIAGNOSIS — Z1231 Encounter for screening mammogram for malignant neoplasm of breast: Secondary | ICD-10-CM | POA: Diagnosis not present

## 2020-03-10 DIAGNOSIS — H60391 Other infective otitis externa, right ear: Secondary | ICD-10-CM | POA: Diagnosis not present

## 2020-03-24 ENCOUNTER — Other Ambulatory Visit: Payer: Self-pay

## 2020-03-24 ENCOUNTER — Encounter: Payer: Self-pay | Admitting: Nurse Practitioner

## 2020-03-24 ENCOUNTER — Ambulatory Visit (INDEPENDENT_AMBULATORY_CARE_PROVIDER_SITE_OTHER): Payer: BC Managed Care – PPO | Admitting: Nurse Practitioner

## 2020-03-24 VITALS — BP 124/75 | HR 79 | Temp 98.5°F | Ht 60.0 in | Wt 192.8 lb

## 2020-03-24 DIAGNOSIS — Z13220 Encounter for screening for lipoid disorders: Secondary | ICD-10-CM

## 2020-03-24 DIAGNOSIS — E6609 Other obesity due to excess calories: Secondary | ICD-10-CM

## 2020-03-24 DIAGNOSIS — Z7689 Persons encountering health services in other specified circumstances: Secondary | ICD-10-CM | POA: Diagnosis not present

## 2020-03-24 DIAGNOSIS — Z131 Encounter for screening for diabetes mellitus: Secondary | ICD-10-CM | POA: Diagnosis not present

## 2020-03-24 DIAGNOSIS — E669 Obesity, unspecified: Secondary | ICD-10-CM | POA: Insufficient documentation

## 2020-03-24 DIAGNOSIS — Z6837 Body mass index (BMI) 37.0-37.9, adult: Secondary | ICD-10-CM

## 2020-03-24 NOTE — Progress Notes (Signed)
New Patient Office Visit  Subjective:  Patient ID: Shari Mccarthy, female    DOB: 09/07/1981  Age: 39 y.o. MRN: [id number]  CC:  Chief Complaint  Patient presents with   Establish Care    HPI RILLIE RIFFEL presents for new patient visit to establish care.  Introduced to Designer, jewellery role and practice setting.  All questions answered.  Discussed provider/patient relationship and expectations.  Has not had primary care provider.    No acute or chronic concerns today.    Past Medical History:  Diagnosis Date   Gestational diabetes     History reviewed. No pertinent surgical history.  Family History  Problem Relation Age of Onset   Cancer Maternal Grandfather    Heart disease Father    Diabetes Paternal Grandfather     Social History   Socioeconomic History   Marital status: Single    Spouse name: Not on file   Number of children: Not on file   Years of education: Not on file   Highest education level: Not on file  Occupational History   Not on file  Tobacco Use   Smoking status: Never Smoker   Smokeless tobacco: Never Used  Substance and Sexual Activity   Alcohol use: No   Drug use: No   Sexual activity: Not Currently  Other Topics Concern   Not on file  Social History Narrative   Not on file   Social Determinants of Health   Financial Resource Strain: Low Risk    Difficulty of Paying Living Expenses: Not hard at all  Food Insecurity: No Food Insecurity   Worried About Charity fundraiser in the Last Year: Never true   [place] in the Last Year: Never true  Transportation Needs: No Transportation Needs   Lack of Transportation (Medical): No   Lack of Transportation (Non-Medical): No  Physical Activity: Insufficiently Active   Days of Exercise per Week: 4 days   Minutes of Exercise per Session: 30 min  Stress: No Stress Concern Present   Feeling of Stress : Not at all  Social Connections: Unknown   Frequency  of Communication with Friends and Family: More than three times a week   Frequency of Social Gatherings with Friends and Family: More than three times a week   Attends Religious Services: Never   Marine scientist or Organizations: No   Attends Music therapist: Never   Marital Status: Patient refused  Human resources officer Violence:    Fear of Current or Ex-Partner:    Emotionally Abused:    Physically Abused:    Sexually Abused:     ROS Review of Systems  Constitutional: Negative for activity change, appetite change, diaphoresis, fatigue and fever.  Respiratory: Negative for cough, chest tightness and shortness of breath.   Cardiovascular: Negative for chest pain, palpitations and leg swelling.  Gastrointestinal: Negative.   Endocrine: Negative.   Neurological: Negative.   Psychiatric/Behavioral: Negative.     Objective:   Today's Vitals: BP 124/75 (BP Location: Left Arm, Patient Position: Sitting, Cuff Size: Normal)    Pulse 79    Temp 98.5 F (36.9 C) (Oral)    Ht 5' (1.524 m)    Wt 192 lb 12.8 oz (87.5 kg)    SpO2 98%    BMI 37.65 kg/m   Physical Exam Vitals and nursing note reviewed.  Constitutional:      General: She is awake. She is not in acute distress.  Appearance: She is well-developed and well-groomed. She is obese. She is not ill-appearing.  HENT:     Head: Normocephalic.     Right Ear: Hearing normal.     Left Ear: Hearing normal.  Eyes:     General: Lids are normal.        Right eye: No discharge.        Left eye: No discharge.     Conjunctiva/sclera: Conjunctivae normal.     Pupils: Pupils are equal, round, and reactive to light.  Neck:     Vascular: No carotid bruit.  Cardiovascular:     Rate and Rhythm: Normal rate and regular rhythm.     Heart sounds: Normal heart sounds. No murmur. No gallop.   Pulmonary:     Effort: Pulmonary effort is normal. No accessory muscle usage or respiratory distress.     Breath sounds: Normal  breath sounds.  Abdominal:     General: Bowel sounds are normal.     Palpations: Abdomen is soft.  Musculoskeletal:     Cervical back: Normal range of motion and neck supple.     Right lower leg: No edema.     Left lower leg: No edema.  Skin:    General: Skin is warm and dry.  Neurological:     Mental Status: She is alert and oriented to person, place, and time.  Psychiatric:        Attention and Perception: Attention normal.        Mood and Affect: Mood normal.        Speech: Speech normal.        Behavior: Behavior normal. Behavior is cooperative.        Thought Content: Thought content normal.     Assessment & Plan:   Problem List Items Addressed This Visit      Other   Class 2 obesity due to excess calories without serious comorbidity with body mass index (BMI) of 37.0 to 37.9 in adult    Recommended eating smaller high protein, low fat meals more frequently and exercising 30 mins a day 5 times a week with a goal of 10-15lb weight loss in the next 3 months. Patient voiced their understanding and motivation to adhere to these recommendations.  Will check labs prior to physical and plan on physical in 4 weeks, fasting labs morning of physical.       Relevant Orders   CBC with Differential/Platelet   Comprehensive metabolic panel   TSH    Other Visit Diagnoses    Encounter to establish care    -  Primary   Screening cholesterol level       Labs ordered for pre physical -- that way patient can fast.   Relevant Orders   Lipid Panel w/o Chol/HDL Ratio   Diabetes mellitus screening       History of gestational diabetes, check A1C at physical   Relevant Orders   HgB A1c      Outpatient Encounter Medications as of 03/24/2020  Medication Sig   calcium carbonate (TUMS) 500 MG chewable tablet Chew 1 tablet (200 mg of elemental calcium total) by mouth daily.   ibuprofen (ADVIL,MOTRIN) 600 MG tablet Take 1 tablet (600 mg total) by mouth every 6 (six) hours.   Multiple  Vitamins-Minerals (WOMENS MULTIVITAMIN PO) Take by mouth.   Norethindrone Acetate-Ethinyl Estrad-FE (LOESTRIN 24 FE) 1-20 MG-MCG(24) tablet Take 1 tablet by mouth daily.   [DISCONTINUED] benzocaine-Menthol (DERMOPLAST) 20-0.5 % AERO Apply 1 application topically  as needed for irritation (perineal discomfort). (Patient not taking: Reported on 03/24/2020)   [DISCONTINUED] lanolin OINT Apply 1 application topically as needed (for breast care). (Patient not taking: Reported on 03/24/2020)   [DISCONTINUED] norethindrone (MICRONOR,CAMILA,ERRIN) 0.35 MG tablet Take 1 tablet (0.35 mg total) by mouth daily. (Patient not taking: Reported on 03/24/2020)   [DISCONTINUED] Prenatal Multivit-Min-Fe-FA (PRENATAL VITAMINS PO) Take 1 tablet by mouth daily.    [DISCONTINUED] witch hazel-glycerin (TUCKS) pad Apply 1 application topically as needed for hemorrhoids. (Patient not taking: Reported on 03/24/2020)   No facility-administered encounter medications on file as of 03/24/2020.    Follow-up: Return in about 4 weeks (around 04/21/2020) for Annual physical.   Marjie Skiff, NP

## 2020-03-24 NOTE — Assessment & Plan Note (Signed)
Recommended eating smaller high protein, low fat meals more frequently and exercising 30 mins a day 5 times a week with a goal of 10-15lb weight loss in the next 3 months. Patient voiced their understanding and motivation to adhere to these recommendations.  Will check labs prior to physical and plan on physical in 4 weeks, fasting labs morning of physical.

## 2020-03-24 NOTE — Patient Instructions (Signed)
Healthy Eating Following a healthy eating pattern may help you to achieve and maintain a healthy body weight, reduce the risk of chronic disease, and live a long and productive life. It is important to follow a healthy eating pattern at an appropriate calorie level for your body. Your nutritional needs should be met primarily through food by choosing a variety of nutrient-rich foods. What are tips for following this plan? Reading food labels  Read labels and choose the following: ? Reduced or low sodium. ? Juices with 100% fruit juice. ? Foods with low saturated fats and high polyunsaturated and monounsaturated fats. ? Foods with whole grains, such as whole wheat, cracked wheat, brown rice, and wild rice. ? Whole grains that are fortified with folic acid. This is recommended for women who are pregnant or who want to become pregnant.  Read labels and avoid the following: ? Foods with a lot of added sugars. These include foods that contain brown sugar, corn sweetener, corn syrup, dextrose, fructose, glucose, high-fructose corn syrup, honey, invert sugar, lactose, malt syrup, maltose, molasses, raw sugar, sucrose, trehalose, or turbinado sugar.  Do not eat more than the following amounts of added sugar per day:  6 teaspoons (25 g) for women.  9 teaspoons (38 g) for men. ? Foods that contain processed or refined starches and grains. ? Refined grain products, such as white flour, degermed cornmeal, white bread, and white rice. Shopping  Choose nutrient-rich snacks, such as vegetables, whole fruits, and nuts. Avoid high-calorie and high-sugar snacks, such as potato chips, fruit snacks, and candy.  Use oil-based dressings and spreads on foods instead of solid fats such as butter, stick margarine, or cream cheese.  Limit pre-made sauces, mixes, and "instant" products such as flavored rice, instant noodles, and ready-made pasta.  Try more plant-protein sources, such as tofu, tempeh, black beans,  edamame, lentils, nuts, and seeds.  Explore eating plans such as the Mediterranean diet or vegetarian diet. Cooking  Use oil to saut or stir-fry foods instead of solid fats such as butter, stick margarine, or lard.  Try baking, boiling, grilling, or broiling instead of frying.  Remove the fatty part of meats before cooking.  Steam vegetables in water or broth. Meal planning   At meals, imagine dividing your plate into fourths: ? One-half of your plate is fruits and vegetables. ? One-fourth of your plate is whole grains. ? One-fourth of your plate is protein, especially lean meats, poultry, eggs, tofu, beans, or nuts.  Include low-fat dairy as part of your daily diet. Lifestyle  Choose healthy options in all settings, including home, work, school, restaurants, or stores.  Prepare your food safely: ? Wash your hands after handling raw meats. ? Keep food preparation surfaces clean by regularly washing with hot, soapy water. ? Keep raw meats separate from ready-to-eat foods, such as fruits and vegetables. ? Cook seafood, meat, poultry, and eggs to the recommended internal temperature. ? Store foods at safe temperatures. In general:  Keep cold foods at 59F (4.4C) or below.  Keep hot foods at 159F (60C) or above.  Keep your freezer at [facility] (-17.8C) or below.  Foods are no longer safe to eat when they have been between the temperatures of 40-159F (4.4-60C) for more than 2 hours. What foods should I eat? Fruits Aim to eat 2 cup-equivalents of fresh, canned (in natural juice), or frozen fruits each day. Examples of 1 cup-equivalent of fruit include 1 small apple, 8 large strawberries, 1 cup canned fruit,  cup  dried fruit, or 1 cup 100% juice. Vegetables Aim to eat 2-3 cup-equivalents of fresh and frozen vegetables each day, including different varieties and colors. Examples of 1 cup-equivalent of vegetables include 2 medium carrots, 2 cups raw, leafy greens, 1 cup chopped  vegetable (raw or cooked), or 1 medium baked potato. Grains Aim to eat 6 ounce-equivalents of whole grains each day. Examples of 1 ounce-equivalent of grains include 1 slice of bread, 1 cup ready-to-eat cereal, 3 cups popcorn, or  cup cooked rice, pasta, or cereal. Meats and other proteins Aim to eat 5-6 ounce-equivalents of protein each day. Examples of 1 ounce-equivalent of protein include 1 egg, 1/2 cup nuts or seeds, or 1 tablespoon (16 g) peanut butter. A cut of meat or fish that is the size of a deck of cards is about 3-4 ounce-equivalents.  Of the protein you eat each week, try to have at least 8 ounces come from seafood. This includes salmon, trout, herring, and anchovies. Dairy Aim to eat 3 cup-equivalents of fat-free or low-fat dairy each day. Examples of 1 cup-equivalent of dairy include 1 cup (240 mL) milk, 8 ounces (250 g) yogurt, 1 ounces (44 g) natural cheese, or 1 cup (240 mL) fortified soy milk. Fats and oils  Aim for about 5 teaspoons (21 g) per day. Choose monounsaturated fats, such as canola and olive oils, avocados, peanut butter, and most nuts, or polyunsaturated fats, such as sunflower, corn, and soybean oils, walnuts, pine nuts, sesame seeds, sunflower seeds, and flaxseed. Beverages  Aim for six 8-oz glasses of water per day. Limit coffee to three to five 8-oz cups per day.  Limit caffeinated beverages that have added calories, such as soda and energy drinks.  Limit alcohol intake to no more than 1 drink a day for nonpregnant women and 2 drinks a day for men. One drink equals 12 oz of beer (355 mL), 5 oz of wine (148 mL), or 1 oz of hard liquor (44 mL). Seasoning and other foods  Avoid adding excess amounts of salt to your foods. Try flavoring foods with herbs and spices instead of salt.  Avoid adding sugar to foods.  Try using oil-based dressings, sauces, and spreads instead of solid fats. This information is based on general [place] nutrition guidelines. For more  information, visit [employer]. Exact amounts may vary based on your nutrition needs. Summary  A healthy eating plan may help you to maintain a healthy weight, reduce the risk of chronic diseases, and stay active throughout your life.  Plan your meals. Make sure you eat the right portions of a variety of nutrient-rich foods.  Try baking, boiling, grilling, or broiling instead of frying.  Choose healthy options in all settings, including home, work, school, restaurants, or stores. This information is not intended to replace advice given to you by your health care provider. Make sure you discuss any questions you have with your health care provider. Document Revised: 01/16/2018 Document Reviewed: 01/16/2018 Elsevier Patient Education  [place].

## 2020-05-01 ENCOUNTER — Encounter: Payer: Self-pay | Admitting: Nurse Practitioner

## 2020-05-01 ENCOUNTER — Other Ambulatory Visit: Payer: Self-pay

## 2020-05-01 ENCOUNTER — Ambulatory Visit (INDEPENDENT_AMBULATORY_CARE_PROVIDER_SITE_OTHER): Payer: BC Managed Care – PPO | Admitting: Nurse Practitioner

## 2020-05-01 VITALS — BP 126/80 | HR 79 | Temp 98.1°F | Ht 59.6 in | Wt 191.0 lb

## 2020-05-01 DIAGNOSIS — Z13220 Encounter for screening for lipoid disorders: Secondary | ICD-10-CM

## 2020-05-01 DIAGNOSIS — E6609 Other obesity due to excess calories: Secondary | ICD-10-CM

## 2020-05-01 DIAGNOSIS — Z1159 Encounter for screening for other viral diseases: Secondary | ICD-10-CM | POA: Diagnosis not present

## 2020-05-01 DIAGNOSIS — E559 Vitamin D deficiency, unspecified: Secondary | ICD-10-CM | POA: Diagnosis not present

## 2020-05-01 DIAGNOSIS — Z0000 Encounter for general adult medical examination without abnormal findings: Secondary | ICD-10-CM | POA: Diagnosis not present

## 2020-05-01 DIAGNOSIS — E66812 Obesity, class 2: Secondary | ICD-10-CM

## 2020-05-01 DIAGNOSIS — Z6837 Body mass index (BMI) 37.0-37.9, adult: Secondary | ICD-10-CM | POA: Diagnosis not present

## 2020-05-01 DIAGNOSIS — Z1329 Encounter for screening for other suspected endocrine disorder: Secondary | ICD-10-CM | POA: Diagnosis not present

## 2020-05-01 NOTE — Patient Instructions (Signed)

## 2020-05-01 NOTE — Assessment & Plan Note (Signed)
Recommended eating smaller high protein, low fat meals more frequently and exercising 30 mins a day 5 times a week with a goal of 10-15lb weight loss in the next 3 months. Patient voiced their understanding and motivation to adhere to these recommendations.  

## 2020-05-01 NOTE — Progress Notes (Signed)
BP 126/80   Pulse 79   Temp 98.1 F (36.7 C) (Oral)   Ht 4' 11.6" (1.514 m)   Wt 191 lb (86.6 kg)   SpO2 98%   BMI 37.80 kg/m    Subjective:    Patient ID: Shari Mccarthy, female    DOB: Mar 16, 1981, 39 y.o.   MRN: [id number]  HPI: Shari Mccarthy is a 39 y.o. female presenting on 05/01/2020 for comprehensive medical examination. Current medical complaints include:none  She currently lives with: children Menopausal Symptoms: no  Depression Screen done today and results listed below:  Depression screen [facility] 2/9 05/01/2020 03/24/2020  Decreased Interest 1 0  Down, Depressed, Hopeless 1 0  PHQ - 2 Score 2 0  Altered sleeping 1 0  Tired, decreased energy 1 0  Change in appetite 1 0  Feeling bad or failure about yourself  0 0  Trouble concentrating 0 0  Moving slowly or fidgety/restless 0 0  Suicidal thoughts 0 0  PHQ-9 Score 5 0  Difficult doing work/chores - Not difficult at all    The patient does not have a history of falls. I did not complete a risk assessment for falls. A plan of care for falls was not documented.   Past Medical History:  Past Medical History:  Diagnosis Date  . Gestational diabetes     Surgical History:  History reviewed. No pertinent surgical history.  Medications:  Current Outpatient Medications on File Prior to Visit  Medication Sig  . calcium carbonate (TUMS) 500 MG chewable tablet Chew 1 tablet (200 mg of elemental calcium total) by mouth daily.  Marland Kitchen ibuprofen (ADVIL,MOTRIN) 600 MG tablet Take 1 tablet (600 mg total) by mouth every 6 (six) hours.  . Multiple Vitamins-Minerals (WOMENS MULTIVITAMIN PO) Take by mouth.  . Norethin Ace-Eth Estrad-FE (BLISOVI 24 FE PO) Take by mouth daily.   No current facility-administered medications on file prior to visit.    Allergies:  No Known Allergies  Social History:  Social History   Socioeconomic History  . Marital status: Single    Spouse name: Not on file  . Number of children: Not on file    . Years of education: Not on file  . Highest education level: Not on file  Occupational History  . Not on file  Tobacco Use  . Smoking status: Never Smoker  . Smokeless tobacco: Never Used  Substance and Sexual Activity  . Alcohol use: No  . Drug use: No  . Sexual activity: Not Currently  Other Topics Concern  . Not on file  Social History Narrative  . Not on file   Social Determinants of Health   Financial Resource Strain: Low Risk   . Difficulty of Paying Living Expenses: Not hard at all  Food Insecurity: No Food Insecurity  . Worried About Programme researcher, broadcasting/film/video in the Last Year: Never true  . Ran Out of Food in the Last Year: Never true  Transportation Needs: No Transportation Needs  . Lack of Transportation (Medical): No  . Lack of Transportation (Non-Medical): No  Physical Activity: Insufficiently Active  . Days of Exercise per Week: 4 days  . Minutes of Exercise per Session: 30 min  Stress: No Stress Concern Present  . Feeling of Stress : Not at all  Social Connections: Unknown  . Frequency of Communication with Friends and Family: More than three times a week  . Frequency of Social Gatherings with Friends and Family: More than three times a week  .  Attends Religious Services: Never  . Active Member of Clubs or Organizations: No  . Attends BankerClub or Organization Meetings: Never  . Marital Status: Patient refused  Intimate Partner Violence:   . Fear of Current or Ex-Partner:   . Emotionally Abused:   Marland Kitchen. Physically Abused:   . Sexually Abused:    Social History   Tobacco Use  Smoking Status Never Smoker  Smokeless Tobacco Never Used   Social History   Substance and Sexual Activity  Alcohol Use No    Family History:  Family History  Problem Relation Age of Onset  . Cancer Maternal Grandfather   . Heart disease Father   . Diabetes Paternal Grandfather     Past medical history, surgical history, medications, allergies, family history and social history  reviewed with patient today and changes made to appropriate areas of the chart.   Review of Systems - negative All other ROS negative except what is listed above and in the HPI.      Objective:    BP 126/80   Pulse 79   Temp 98.1 F (36.7 C) (Oral)   Ht 4' 11.6" (1.514 m)   Wt 191 lb (86.6 kg)   SpO2 98%   BMI 37.80 kg/m   Wt Readings from Last 3 Encounters:  05/01/20 191 lb (86.6 kg)  03/24/20 192 lb 12.8 oz (87.5 kg)  02/23/15 195 lb (88.5 kg)    Physical Exam Constitutional:      General: She is awake. She is not in acute distress.    Appearance: She is well-developed. She is not ill-appearing.  HENT:     Head: Normocephalic and atraumatic.     Right Ear: Hearing, tympanic membrane, ear canal and external ear normal. No drainage.     Left Ear: Hearing, tympanic membrane, ear canal and external ear normal. No drainage.     Nose: Nose normal.     Right Sinus: No maxillary sinus tenderness or frontal sinus tenderness.     Left Sinus: No maxillary sinus tenderness or frontal sinus tenderness.     Mouth/Throat:     Mouth: Mucous membranes are moist.     Pharynx: Oropharynx is clear. Uvula midline. No pharyngeal swelling, oropharyngeal exudate or posterior oropharyngeal erythema.  Eyes:     General: Lids are normal.        Right eye: No discharge.        Left eye: No discharge.     Extraocular Movements: Extraocular movements intact.     Conjunctiva/sclera: Conjunctivae normal.     Pupils: Pupils are equal, round, and reactive to light.     Visual Fields: Right eye visual fields normal and left eye visual fields normal.  Neck:     Thyroid: No thyromegaly.     Vascular: No carotid bruit.     Trachea: Trachea normal.  Cardiovascular:     Rate and Rhythm: Normal rate and regular rhythm.     Heart sounds: Normal heart sounds. No murmur heard.  No gallop.   Pulmonary:     Effort: Pulmonary effort is normal. No accessory muscle usage or respiratory distress.     Breath  sounds: Normal breath sounds.  Chest:     Breasts:        Right: Normal.        Left: Normal.  Abdominal:     General: Bowel sounds are normal.     Palpations: Abdomen is soft. There is no hepatomegaly or splenomegaly.  Tenderness: There is no abdominal tenderness.  Musculoskeletal:        General: Normal range of motion.     Cervical back: Normal range of motion and neck supple.     Right lower leg: No edema.     Left lower leg: No edema.  Lymphadenopathy:     Head:     Right side of head: No submental, submandibular, tonsillar, preauricular or posterior auricular adenopathy.     Left side of head: No submental, submandibular, tonsillar, preauricular or posterior auricular adenopathy.     Cervical: No cervical adenopathy.     Upper Body:     Right upper body: No supraclavicular, axillary or pectoral adenopathy.     Left upper body: No supraclavicular, axillary or pectoral adenopathy.  Skin:    General: Skin is warm and dry.     Capillary Refill: Capillary refill takes less than 2 seconds.     Findings: No rash.  Neurological:     Mental Status: She is alert and oriented to person, place, and time.     Cranial Nerves: Cranial nerves are intact.     Gait: Gait is intact.     Deep Tendon Reflexes: Reflexes are normal and symmetric.     Reflex Scores:      Brachioradialis reflexes are 2+ on the right side and 2+ on the left side.      Patellar reflexes are 2+ on the right side and 2+ on the left side. Psychiatric:        Attention and Perception: Attention normal.        Mood and Affect: Mood normal.        Speech: Speech normal.        Behavior: Behavior normal. Behavior is cooperative.        Thought Content: Thought content normal.        Judgment: Judgment normal.    Results for orders placed or performed during the hospital encounter of 02/23/15  OB RESULT CONSOLE Group B Strep  Result Value Ref Range   GBS Negative   HIV antibody  Result Value Ref Range   HIV  Screen 4th Generation wRfx UNABLE TO PERFORM TEST DUE TO LABORATORY ERROR   CBC  Result Value Ref Range   WBC 9.7 3.6 - 11.0 K/uL   RBC 4.02 3.80 - 5.20 MIL/uL   Hemoglobin 13.1 12.0 - 16.0 g/dL   HCT 73.2 35 - 47 %   MCV 95.6 80.0 - 100.0 fL   MCH 32.6 26.0 - 34.0 pg   MCHC 34.1 32.0 - 36.0 g/dL   RDW 20.2 54.2 - 70.6 %   Platelets 247 150 - 440 K/uL  RPR  Result Value Ref Range   RPR Ser Ql Non Reactive Non Reactive  Comprehensive metabolic panel  Result Value Ref Range   Sodium 137 135 - 145 mmol/L   Potassium 4.3 3.5 - 5.1 mmol/L   Chloride 106 101 - 111 mmol/L   CO2 22 22 - 32 mmol/L   Glucose, Bld 75 65 - 99 mg/dL   BUN 9 6 - 20 mg/dL   Creatinine, Ser 2.37 0.44 - 1.00 mg/dL   Calcium 8.5 (L) 8.9 - 10.3 mg/dL   Total Protein 6.5 6.5 - 8.1 g/dL   Albumin 2.9 (L) 3.5 - 5.0 g/dL   AST 30 15 - 41 U/L   ALT 17 14 - 54 U/L   Alkaline Phosphatase 207 (H) 38 - 126 U/L   Total  Bilirubin 0.8 0.3 - 1.2 mg/dL   GFR calc non Af Amer >60 >60 mL/min   GFR calc Af Amer >60 >60 mL/min   Anion gap 9 5 - 15  Uric acid  Result Value Ref Range   Uric Acid, Serum 6.5 2.3 - 6.6 mg/dL  OB RESULTS CONSOLE GC/Chlamydia  Result Value Ref Range   Gonorrhea Negative    Chlamydia Negative   OB RESULTS CONSOLE GC/Chlamydia  Result Value Ref Range   Gonorrhea Negative    Chlamydia Negative   OB RESULTS CONSOLE RPR  Result Value Ref Range   RPR Nonreactive   OB RESULTS CONSOLE HIV antibody  Result Value Ref Range   HIV Non-reactive   OB RESULTS CONSOLE Varicella zoster antibody, IgG  Result Value Ref Range   Varicella Immune   OB RESULTS CONSOLE Hemoglobin and hematocrit, blood  Result Value Ref Range   Hemoglobin 11.7 g/dL  OB RESULTS CONSOLE Hemoglobin and hematocrit, blood  Result Value Ref Range   Hemoglobin 11.0 g/dL  OB RESULTS CONSOLE PLATELET COUNT  Result Value Ref Range   Platelets 352 K/L  OB RESULTS CONSOLE Rubella Antibody  Result Value Ref Range   Rubella Immune     CBC  Result Value Ref Range   WBC 8.7 3.6 - 11.0 K/uL   RBC 3.83 3.80 - 5.20 MIL/uL   Hemoglobin 12.4 12.0 - 16.0 g/dL   HCT 54.0 35 - 47 %   MCV 95.5 80.0 - 100.0 fL   MCH 32.5 26.0 - 34.0 pg   MCHC 34.1 32.0 - 36.0 g/dL   RDW 98.1 19.1 - 47.8 %   Platelets 230 150 - 440 K/uL  Type and screen  Result Value Ref Range   ABO/RH(D) A POS    Antibody Screen NEG    Sample Expiration 02/27/2015   ABO/Rh  Result Value Ref Range   ABO/RH(D) A POS       Assessment & Plan:   Problem List Items Addressed This Visit      Other   Class 2 obesity due to excess calories without serious comorbidity with body mass index (BMI) of 37.0 to 37.9 in adult    Recommended eating smaller high protein, low fat meals more frequently and exercising 30 mins a day 5 times a week with a goal of 10-15lb weight loss in the next 3 months. Patient voiced their understanding and motivation to adhere to these recommendations.       Relevant Orders   CBC with Differential/Platelet   Comprehensive metabolic panel    Other Visit Diagnoses    Routine general medical examination at a health care facility    -  Primary   Vitamin D deficiency       History of, will check level today.   Relevant Orders   VITAMIN D 25 Hydroxy (Vit-D Deficiency, Fractures)   Thyroid disorder screen       TSH on labs   Relevant Orders   TSH   Need for hepatitis C screening test       Hep C screening today   Relevant Orders   Hepatitis C antibody   Lipid screening       Lipid panel today   Relevant Orders   Lipid Panel w/o Chol/HDL Ratio       Follow up plan: Return in about 1 day (around 05/02/2020) for Anxiety.   LABORATORY TESTING:  - Pap smear: up to date  IMMUNIZATIONS:   - Tdap: Tetanus  vaccination status reviewed: last tetanus booster within 10 years. - Influenza: Up to date - Pneumovax: Not applicable - Prevnar: Not applicable - HPV: Not applicable - Zostavax vaccine: Not  applicable  SCREENING: -Mammogram: Not applicable  - Colonoscopy: Not applicable  - Bone Density: Not applicable  -Hearing Test: Not applicable  -Spirometry: Not applicable   PATIENT COUNSELING:   Advised to take 1 mg of folate supplement per day if capable of pregnancy.   Sexuality: Discussed sexually transmitted diseases, partner selection, use of condoms, avoidance of unintended pregnancy  and contraceptive alternatives.   Advised to avoid cigarette smoking.  I discussed with the patient that most people either abstain from alcohol or drink within safe limits (<=14/week and <=4 drinks/occasion for males, <=7/weeks and <= 3 drinks/occasion for females) and that the risk for alcohol disorders and other health effects rises proportionally with the number of drinks per week and how often a drinker exceeds daily limits.  Discussed cessation/primary prevention of drug use and availability of treatment for abuse.   Diet: Encouraged to adjust caloric intake to maintain  or achieve ideal body weight, to reduce intake of dietary saturated fat and total fat, to limit sodium intake by avoiding high sodium foods and not adding table salt, and to maintain adequate dietary potassium and calcium preferably from fresh fruits, vegetables, and low-fat dairy products.    stressed the importance of regular exercise  Injury prevention: Discussed safety belts, safety helmets, smoke detector, smoking near bedding or upholstery.   Dental health: Discussed importance of regular tooth brushing, flossing, and dental visits.    NEXT PREVENTATIVE PHYSICAL DUE IN 1 YEAR. Return in about 1 day (around 05/02/2020) for Anxiety.

## 2020-05-02 ENCOUNTER — Ambulatory Visit: Payer: BC Managed Care – PPO | Admitting: Nurse Practitioner

## 2020-05-02 LAB — COMPREHENSIVE METABOLIC PANEL
ALT: 19 IU/L (ref 0–32)
AST: 18 IU/L (ref 0–40)
Albumin/Globulin Ratio: 1.7 (ref 1.2–2.2)
Albumin: 4.4 g/dL (ref 3.8–4.8)
Alkaline Phosphatase: 72 IU/L (ref 48–121)
BUN/Creatinine Ratio: 17 (ref 9–23)
BUN: 9 mg/dL (ref 6–20)
Bilirubin Total: 0.5 mg/dL (ref 0.0–1.2)
CO2: 21 mmol/L (ref 20–29)
Calcium: 9 mg/dL (ref 8.7–10.2)
Chloride: 102 mmol/L (ref 96–106)
Creatinine, Ser: 0.54 mg/dL — ABNORMAL LOW (ref 0.57–1.00)
GFR calc Af Amer: 140 mL/min/{1.73_m2} (ref >59)
GFR calc non Af Amer: 122 mL/min/{1.73_m2} (ref >59)
Globulin, Total: 2.6 g/dL (ref 1.5–4.5)
Glucose: 101 mg/dL — ABNORMAL HIGH (ref 65–99)
Potassium: 4.3 mmol/L (ref 3.5–5.2)
Sodium: 138 mmol/L (ref 134–144)
Total Protein: 7 g/dL (ref 6.0–8.5)

## 2020-05-02 LAB — LIPID PANEL W/O CHOL/HDL RATIO
Cholesterol, Total: 194 mg/dL (ref 100–199)
HDL: 52 mg/dL (ref >39)
LDL Chol Calc (NIH): 127 mg/dL — ABNORMAL HIGH (ref 0–99)
Triglycerides: 80 mg/dL (ref 0–149)
VLDL Cholesterol Cal: 15 mg/dL (ref 5–40)

## 2020-05-02 LAB — CBC WITH DIFFERENTIAL/PLATELET
Basophils Absolute: 0 10*3/uL (ref 0.0–0.2)
Basos: 0 %
EOS (ABSOLUTE): 0.1 10*3/uL (ref 0.0–0.4)
Eos: 1 %
Hematocrit: 37.4 % (ref 34.0–46.6)
Hemoglobin: 13 g/dL (ref 11.1–15.9)
Immature Grans (Abs): 0 10*3/uL (ref 0.0–0.1)
Immature Granulocytes: 0 %
Lymphocytes Absolute: 2.4 10*3/uL (ref 0.7–3.1)
Lymphs: 34 %
MCH: 31.7 pg (ref 26.6–33.0)
MCHC: 34.8 g/dL (ref 31.5–35.7)
MCV: 91 fL (ref 79–97)
Monocytes Absolute: 0.4 10*3/uL (ref 0.1–0.9)
Monocytes: 5 %
Neutrophils Absolute: 4.3 10*3/uL (ref 1.4–7.0)
Neutrophils: 60 %
Platelets: 313 10*3/uL (ref 150–450)
RBC: 4.1 x10E6/uL (ref 3.77–5.28)
RDW: 12.3 % (ref 11.7–15.4)
WBC: 7.2 10*3/uL (ref 3.4–10.8)

## 2020-05-02 LAB — HEPATITIS C ANTIBODY: Hep C Virus Ab: <0.1 s/co ratio (ref 0.0–0.9)

## 2020-05-02 LAB — TSH: TSH: 2.26 u[IU]/mL (ref 0.450–4.500)

## 2020-05-02 LAB — VITAMIN D 25 HYDROXY (VIT D DEFICIENCY, FRACTURES): Vit D, 25-Hydroxy: 32 ng/mL (ref 30.0–100.0)

## 2020-05-02 NOTE — Progress Notes (Signed)
Please let Ermel know that labs look good.  Only exception is LDL (bad cholesterol) which is mildly elevated.  Recommend focus on healthy diet changes and regular exercise.  Will recheck annually.  Have a great day!!

## 2020-07-16 ENCOUNTER — Ambulatory Visit: Payer: BC Managed Care – PPO | Admitting: Nurse Practitioner

## 2020-08-07 ENCOUNTER — Other Ambulatory Visit: Payer: Self-pay | Admitting: Nurse Practitioner

## 2020-08-07 DIAGNOSIS — Z3009 Encounter for other general counseling and advice on contraception: Secondary | ICD-10-CM

## 2020-08-07 NOTE — Telephone Encounter (Signed)
Please see if she can come in and perform outpatient urine pregnancy test, I will order.  If negative I will send in these refills.  Thank you.

## 2020-08-07 NOTE — Telephone Encounter (Signed)
   Future visit scheduled:no  Notes to clinic:  Last filled by historical provider review for refill    Requested Prescriptions  Pending Prescriptions Disp Refills   Norethindrone Acetate-Ethinyl Estrad-FE (BLISOVI 24 FE) 1-20 MG-MCG(24) tablet 28 tablet 11    Sig: Take by mouth daily.      OB/GYN:  Contraceptives Passed - 08/07/2020  2:34 PM      Passed - Last BP in normal range    BP Readings from Last 1 Encounters:  05/01/20 126/80          Passed - Valid encounter within last 12 months    Recent Outpatient Visits           3 months ago Routine general medical examination at a health care facility   [facility], Corrie Dandy T, NP   4 months ago Encounter to establish care   [facility], Dorie Rank, NP

## 2020-08-07 NOTE — Telephone Encounter (Signed)
Pt called asking if she could get a refill on her BC pills.  She use to get them with her /dr. At [facility]  [facility] [address]  She is also going to [facility]

## 2020-08-07 NOTE — Telephone Encounter (Signed)
Patient last seen 05/01/20

## 2020-08-08 ENCOUNTER — Other Ambulatory Visit: Payer: Self-pay | Admitting: Nurse Practitioner

## 2020-08-08 MED ORDER — BLISOVI 24 FE 1-20 MG-MCG(24) PO TABS
1.0000 | ORAL_TABLET | Freq: Every day | ORAL | 11 refills | Status: DC
Start: 2020-08-08 — End: 2021-05-05

## 2020-08-08 NOTE — Telephone Encounter (Signed)
Patient would like to speak with the nurse regarding her refill.  She does not understand why she needs a pregnancy test because she said she has been taking this medication for 4 years.  Please call patient to discuss at [phone]

## 2020-08-08 NOTE — Telephone Encounter (Signed)
Please call and schedule lab visit per Jolene.

## 2020-08-08 NOTE — Telephone Encounter (Signed)
unable to lvm to make this Lab apt.

## 2020-08-08 NOTE — Telephone Encounter (Signed)
Routing to provider for reasoning.

## 2020-08-08 NOTE — Telephone Encounter (Signed)
Patient notified

## 2020-09-18 DIAGNOSIS — Z03818 Encounter for observation for suspected exposure to other biological agents ruled out: Secondary | ICD-10-CM | POA: Diagnosis not present

## 2020-09-18 DIAGNOSIS — Z20822 Contact with and (suspected) exposure to covid-19: Secondary | ICD-10-CM | POA: Diagnosis not present

## 2020-11-12 ENCOUNTER — Ambulatory Visit: Payer: BC Managed Care – PPO | Admitting: Nurse Practitioner

## 2020-12-02 ENCOUNTER — Ambulatory Visit: Payer: BC Managed Care – PPO | Admitting: Nurse Practitioner

## 2020-12-08 ENCOUNTER — Ambulatory Visit (INDEPENDENT_AMBULATORY_CARE_PROVIDER_SITE_OTHER): Payer: BC Managed Care – PPO | Admitting: Nurse Practitioner

## 2020-12-08 ENCOUNTER — Encounter: Payer: Self-pay | Admitting: Nurse Practitioner

## 2020-12-08 ENCOUNTER — Other Ambulatory Visit: Payer: Self-pay

## 2020-12-08 DIAGNOSIS — H6983 Other specified disorders of Eustachian tube, bilateral: Secondary | ICD-10-CM | POA: Diagnosis not present

## 2020-12-08 MED ORDER — PREDNISONE 20 MG PO TABS: 40.0000 mg | ORAL_TABLET | Freq: Every day | ORAL | 0 refills | Status: AC

## 2020-12-08 NOTE — Patient Instructions (Signed)

## 2020-12-08 NOTE — Assessment & Plan Note (Signed)
Noted on exam, no signs of infection.  Script for Prednisone 40 MG daily x 5 days and recommend she take Claritin 10 MG daily to help with nay nasal drainage.  Avoid Q-tips.  Return to office if ongoing or worsening symptoms.

## 2020-12-08 NOTE — Progress Notes (Signed)
BP 107/76   Pulse 90   Temp 98.5 F (36.9 C) (Oral)   Wt 192 lb 6.4 oz (87.3 kg)   SpO2 97%   BMI 38.08 kg/m    Subjective:    Patient ID: Shari Mccarthy, female    DOB: 1981/07/21, 40 y.o.   MRN: [id number]  HPI: Shari Mccarthy is a 40 y.o. female  Chief Complaint  Patient presents with  . Ear Pain    Right ear. Pt states she has been having ear pain for the past week. Pt states the pain comes and goes and states she had an ear exam at work last week and the lady told her to keep an eye on it because it was irritated at that moment. Pt denies having any ringing or congestion.    EAR PAIN Started a couple days ago.  Had hearing test two weeks ago and was told right ear was a little irritated, to keep eye on it.   Duration: days Involved ear(s): right Severity:  4/10  Quality:  Random sharp pain Fever: no Otorrhea: no Upper respiratory infection symptoms: no Pruritus: no Hearing loss: no Water immersion no Using Q-tips: yes Recurrent otitis media: no Status: stable Treatments attempted: none  Relevant past medical, surgical, family and social history reviewed and updated as indicated. Interim medical history since our last visit reviewed. Allergies and medications reviewed and updated.  Review of Systems  Constitutional: Negative.   HENT: Positive for ear pain. Negative for ear discharge.   Respiratory: Negative.   Cardiovascular: Negative.   Neurological: Negative.   Psychiatric/Behavioral: Negative.     Per HPI unless specifically indicated above     Objective:    BP 107/76   Pulse 90   Temp 98.5 F (36.9 C) (Oral)   Wt 192 lb 6.4 oz (87.3 kg)   SpO2 97%   BMI 38.08 kg/m   Wt Readings from Last 3 Encounters:  12/08/20 192 lb 6.4 oz (87.3 kg)  05/01/20 191 lb (86.6 kg)  03/24/20 192 lb 12.8 oz (87.5 kg)    Physical Exam Vitals and nursing note reviewed.  Constitutional:      General: She is awake. She is not in acute distress.    Appearance:  She is well-developed and well-groomed. She is obese. She is not ill-appearing.  HENT:     Head: Normocephalic.     Right Ear: Hearing, ear canal and external ear normal. No drainage or tenderness. A middle ear effusion is present. There is no impacted cerumen. Tympanic membrane is not injected.     Left Ear: Hearing and ear canal normal. No drainage or tenderness. A middle ear effusion is present. There is no impacted cerumen. Tympanic membrane is not injected.     Nose: Nose normal.     Mouth/Throat:     Mouth: Mucous membranes are moist.     Pharynx: Oropharynx is clear.  Eyes:     General: Lids are normal.        Right eye: No discharge.        Left eye: No discharge.     Conjunctiva/sclera: Conjunctivae normal.     Pupils: Pupils are equal, round, and reactive to light.  Neck:     Thyroid: No thyromegaly.     Vascular: No carotid bruit or JVD.  Cardiovascular:     Rate and Rhythm: Normal rate and regular rhythm.     Heart sounds: Normal heart sounds. No murmur heard. No gallop.  Pulmonary:     Effort: Pulmonary effort is normal.     Breath sounds: Normal breath sounds.  Abdominal:     General: Bowel sounds are normal.     Palpations: Abdomen is soft. There is no hepatomegaly or splenomegaly.  Musculoskeletal:     Cervical back: Normal range of motion and neck supple.     Right lower leg: No edema.     Left lower leg: No edema.  Lymphadenopathy:     Cervical: No cervical adenopathy.  Skin:    General: Skin is warm and dry.  Neurological:     Mental Status: She is alert and oriented to person, place, and time.  Psychiatric:        Attention and Perception: Attention normal.        Mood and Affect: Mood normal.        Speech: Speech normal.        Behavior: Behavior normal. Behavior is cooperative.        Thought Content: Thought content normal.    Results for orders placed or performed in visit on 05/01/20  CBC with Differential/Platelet  Result Value Ref Range    WBC 7.2 3.4 - 10.8 x10E3/uL   RBC 4.10 3.77 - 5.28 x10E6/uL   Hemoglobin 13.0 11.1 - 15.9 g/dL   Hematocrit 62.3 76.2 - 46.6 %   MCV 91 79 - 97 fL   MCH 31.7 26.6 - 33.0 pg   MCHC 34.8 31.5 - 35.7 g/dL   RDW 83.1 51.7 - 61.6 %   Platelets 313 150 - 450 x10E3/uL   Neutrophils 60 Not Estab. %   Lymphs 34 Not Estab. %   Monocytes 5 Not Estab. %   Eos 1 Not Estab. %   Basos 0 Not Estab. %   Neutrophils Absolute 4.3 1.4 - 7.0 x10E3/uL   Lymphocytes Absolute 2.4 0.7 - 3.1 x10E3/uL   Monocytes Absolute 0.4 0.1 - 0.9 x10E3/uL   EOS (ABSOLUTE) 0.1 0.0 - 0.4 x10E3/uL   Basophils Absolute 0.0 0.0 - 0.2 x10E3/uL   Immature Granulocytes 0 Not Estab. %   Immature Grans (Abs) 0.0 0.0 - 0.1 x10E3/uL  Comprehensive metabolic panel  Result Value Ref Range   Glucose 101 (H) 65 - 99 mg/dL   BUN 9 6 - 20 mg/dL   Creatinine, Ser 0.73 (L) 0.57 - 1.00 mg/dL   GFR calc non Af Amer 122 >59 mL/min/1.73   GFR calc Af Amer 140 >59 mL/min/1.73   BUN/Creatinine Ratio 17 9 - 23   Sodium 138 134 - 144 mmol/L   Potassium 4.3 3.5 - 5.2 mmol/L   Chloride 102 96 - 106 mmol/L   CO2 21 20 - 29 mmol/L   Calcium 9.0 8.7 - 10.2 mg/dL   Total Protein 7.0 6.0 - 8.5 g/dL   Albumin 4.4 3.8 - 4.8 g/dL   Globulin, Total 2.6 1.5 - 4.5 g/dL   Albumin/Globulin Ratio 1.7 1.2 - 2.2   Bilirubin Total 0.5 0.0 - 1.2 mg/dL   Alkaline Phosphatase 72 48 - 121 IU/L   AST 18 0 - 40 IU/L   ALT 19 0 - 32 IU/L  VITAMIN D 25 Hydroxy (Vit-D Deficiency, Fractures)  Result Value Ref Range   Vit D, 25-Hydroxy 32.0 30.0 - 100.0 ng/mL  TSH  Result Value Ref Range   TSH 2.260 0.450 - 4.500 uIU/mL  Lipid Panel w/o Chol/HDL Ratio  Result Value Ref Range   Cholesterol, Total 194 100 - 199 mg/dL  Triglycerides 80 0 - 149 mg/dL   HDL 52 >96 mg/dL   VLDL Cholesterol Cal 15 5 - 40 mg/dL   LDL Chol Calc (NIH) 045 (H) 0 - 99 mg/dL  Hepatitis C antibody  Result Value Ref Range   Hep C Virus Ab <0.1 0.0 - 0.9 s/co ratio      Assessment &  Plan:   Problem List Items Addressed This Visit      Nervous and Auditory   Eustachian tube dysfunction, bilateral    Noted on exam, no signs of infection.  Script for Prednisone 40 MG daily x 5 days and recommend she take Claritin 10 MG daily to help with nay nasal drainage.  Avoid Q-tips.  Return to office if ongoing or worsening symptoms.          Follow up plan: Return if symptoms worsen or fail to improve.

## 2020-12-23 ENCOUNTER — Other Ambulatory Visit: Payer: Self-pay

## 2020-12-23 ENCOUNTER — Emergency Department
Admission: EM | Admit: 2020-12-23 | Discharge: 2020-12-23 | Disposition: A | Discharge status: Home / Self Care | Payer: BC Managed Care – PPO | Attending: Emergency Medicine | Admitting: Emergency Medicine

## 2020-12-23 ENCOUNTER — Encounter: Payer: Self-pay | Admitting: Emergency Medicine

## 2020-12-23 ENCOUNTER — Emergency Department: Payer: BC Managed Care – PPO

## 2020-12-23 DIAGNOSIS — S20312A Abrasion of left front wall of thorax, initial encounter: Secondary | ICD-10-CM | POA: Insufficient documentation

## 2020-12-23 DIAGNOSIS — M7918 Myalgia, other site: Secondary | ICD-10-CM

## 2020-12-23 DIAGNOSIS — S4992XA Unspecified injury of left shoulder and upper arm, initial encounter: Secondary | ICD-10-CM | POA: Diagnosis not present

## 2020-12-23 DIAGNOSIS — R0781 Pleurodynia: Secondary | ICD-10-CM | POA: Diagnosis not present

## 2020-12-23 DIAGNOSIS — S40812A Abrasion of left upper arm, initial encounter: Secondary | ICD-10-CM | POA: Diagnosis not present

## 2020-12-23 DIAGNOSIS — M79622 Pain in left upper arm: Secondary | ICD-10-CM | POA: Diagnosis not present

## 2020-12-23 DIAGNOSIS — Y92410 Unspecified street and highway as the place of occurrence of the external cause: Secondary | ICD-10-CM | POA: Diagnosis not present

## 2020-12-23 DIAGNOSIS — T07XXXA Unspecified multiple injuries, initial encounter: Secondary | ICD-10-CM

## 2020-12-23 DIAGNOSIS — M791 Myalgia, unspecified site: Secondary | ICD-10-CM | POA: Diagnosis not present

## 2020-12-23 MED ORDER — OXYCODONE-ACETAMINOPHEN 5-325 MG PO TABS
1.0000 | ORAL_TABLET | Freq: Once | ORAL | Status: AC
  Administered 2020-12-23: 1 via ORAL
  Filled 2020-12-23: qty 1

## 2020-12-23 MED ORDER — BACITRACIN-NEOMYCIN-POLYMYXIN 400-5-5000 EX OINT
TOPICAL_OINTMENT | CUTANEOUS | Status: AC
  Administered 2020-12-23: 1 via TOPICAL
  Filled 2020-12-23: qty 1

## 2020-12-23 MED ORDER — IBUPROFEN 600 MG PO TABS
600.0000 mg | ORAL_TABLET | Freq: Once | ORAL | Status: AC
  Administered 2020-12-23: 600 mg via ORAL
  Filled 2020-12-23: qty 1

## 2020-12-23 MED ORDER — OXYCODONE-ACETAMINOPHEN 7.5-325 MG PO TABS: 1.0000 | ORAL_TABLET | Freq: Four times a day (QID) | ORAL | 0 refills | Status: DC | PRN

## 2020-12-23 MED ORDER — NEOSPORIN PLUS PAIN RELIEF MS 3.5-10000-10 EX CREA: TOPICAL_CREAM | Freq: Two times a day (BID) | CUTANEOUS | 0 refills | Status: DC

## 2020-12-23 MED ORDER — BACITRACIN-NEOMYCIN-POLYMYXIN OINTMENT TUBE
TOPICAL_OINTMENT | Freq: Once | CUTANEOUS | Status: AC
  Filled 2020-12-23: qty 14.17

## 2020-12-23 MED ORDER — CYCLOBENZAPRINE HCL 10 MG PO TABS
10.0000 mg | ORAL_TABLET | Freq: Once | ORAL | Status: AC
  Administered 2020-12-23: 10 mg via ORAL
  Filled 2020-12-23: qty 1

## 2020-12-23 MED ORDER — ORPHENADRINE CITRATE ER 100 MG PO TB12: 100.0000 mg | ORAL_TABLET | Freq: Two times a day (BID) | ORAL | 0 refills | Status: DC

## 2020-12-23 MED ORDER — NAPROXEN 500 MG PO TABS: 500.0000 mg | ORAL_TABLET | Freq: Two times a day (BID) | ORAL | Status: DC
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
## 2020-12-23 NOTE — Discharge Instructions (Signed)
No acute findings on x-ray of the left upper arm and left ribs. Follow discharge care instruction take medication as directed. Be advised pain medication muscle relaxant may cause drowsiness.
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
## 2020-12-24 ENCOUNTER — Telehealth: Payer: Self-pay

## 2020-12-24 NOTE — Telephone Encounter (Signed)
Transition Care Management Unsuccessful Follow-up Telephone Call  Date of discharge and from where:  12/23/2020 from [facility]  Attempts:  1st Attempt  Reason for unsuccessful TCM follow-up call:  Unable to leave message

## 2020-12-25 NOTE — Telephone Encounter (Signed)
Transition Care Management Unsuccessful Follow-up Telephone Call  Date of discharge and from where:  12/23/2020 from [facility]   Attempts:  2nd Attempt  Reason for unsuccessful TCM follow-up call:  Unable to reach patient

## 2020-12-26 NOTE — Telephone Encounter (Signed)
Transition Care Management Unsuccessful Follow-up Telephone Call  Date of discharge and from where:  12/23/2020 from [facility]  Attempts:  3rd Attempt  Reason for unsuccessful TCM follow-up call:  Unable to leave message

## 2021-05-03 ENCOUNTER — Encounter: Payer: Self-pay | Admitting: Nurse Practitioner

## 2021-05-03 DIAGNOSIS — Z8632 Personal history of gestational diabetes: Secondary | ICD-10-CM | POA: Insufficient documentation

## 2021-05-05 ENCOUNTER — Other Ambulatory Visit: Payer: Self-pay | Admitting: Nurse Practitioner

## 2021-05-08 ENCOUNTER — Encounter: Payer: BC Managed Care – PPO | Admitting: Nurse Practitioner

## 2021-05-22 IMAGING — CR DG RIBS W/ CHEST 3+V*L*
1 series · 3 of 3 positions shown · non-contrast
Comparison: None.

CLINICAL DATA: Pain

EXAM:
LEFT RIBS AND CHEST - 3+ VIEW

[Series 1: dg ribs unilateral w/chest left · 0.14mm/px · 3 of 3 slices shown]
[im 1/3]
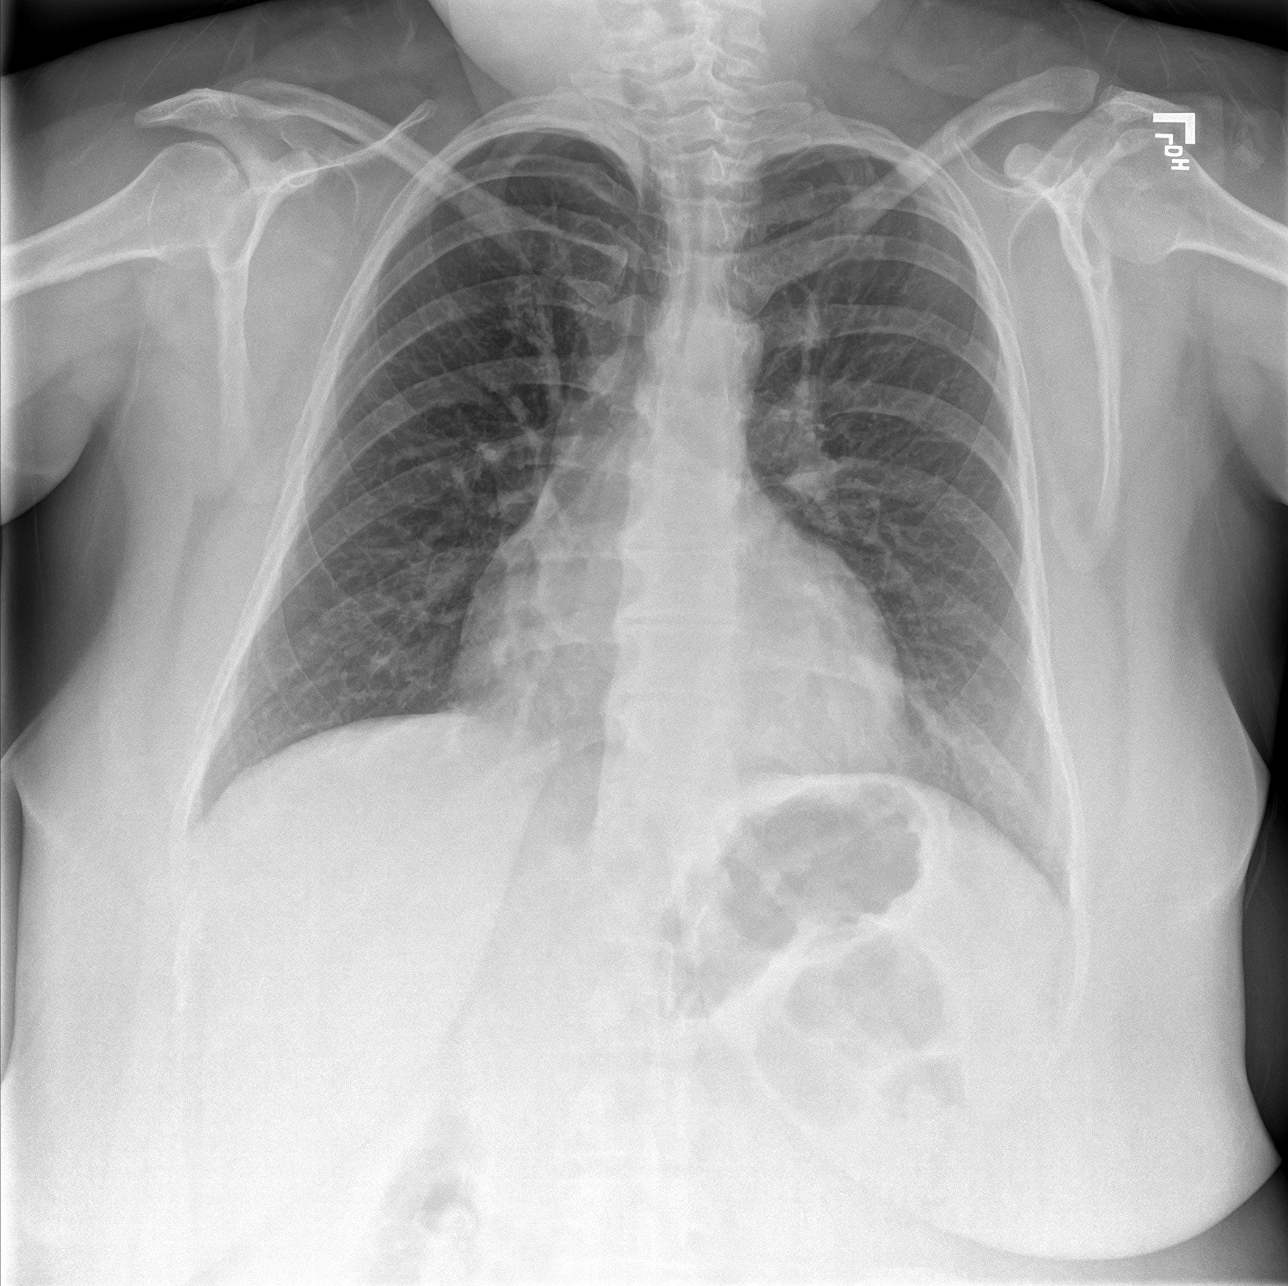
[im 2/3]
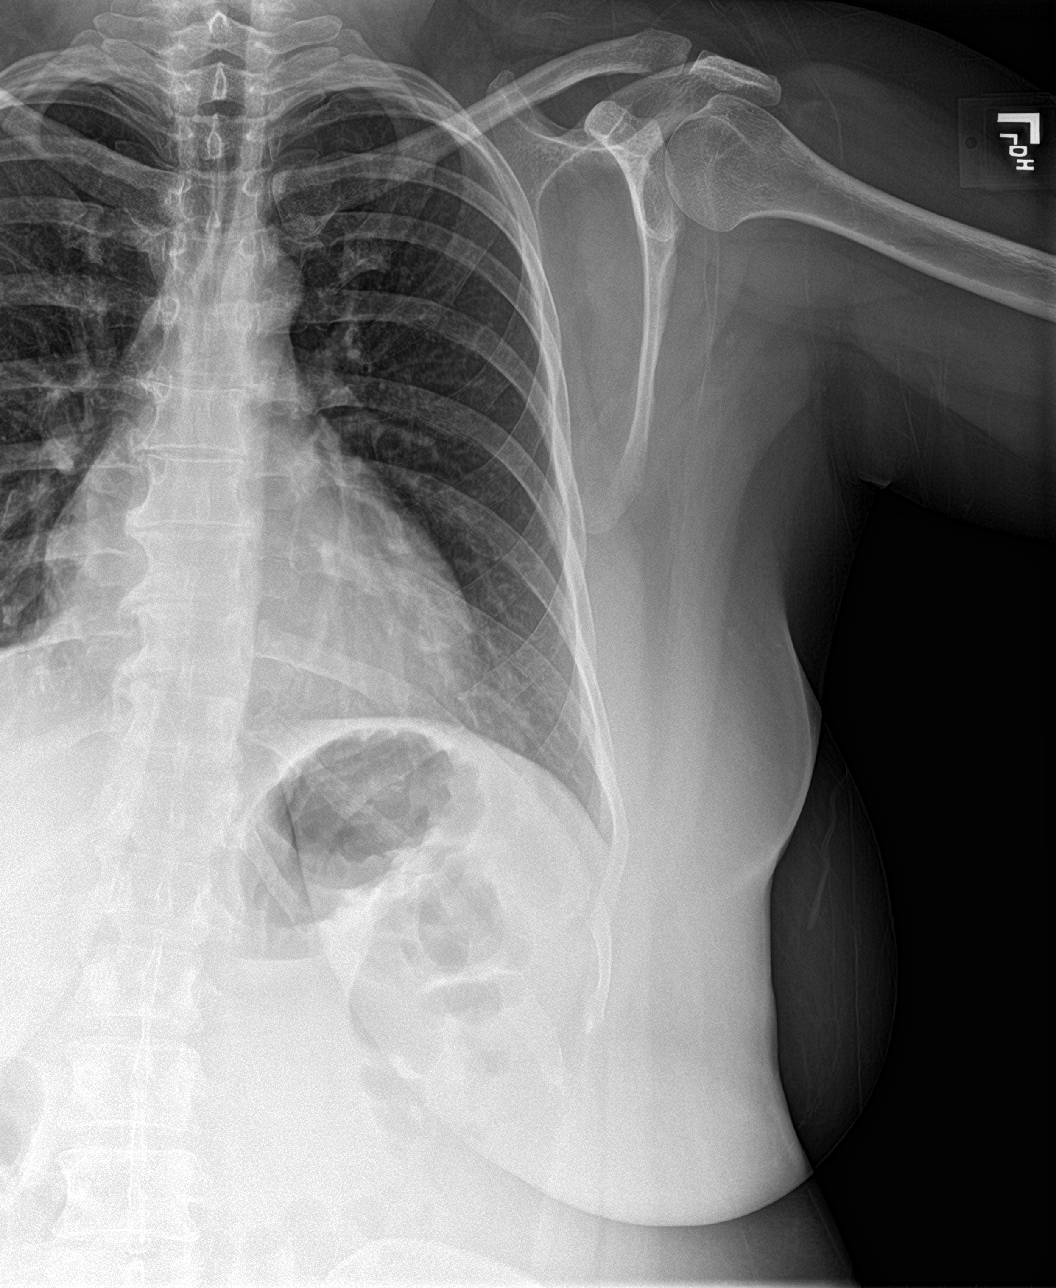
[im 3/3]
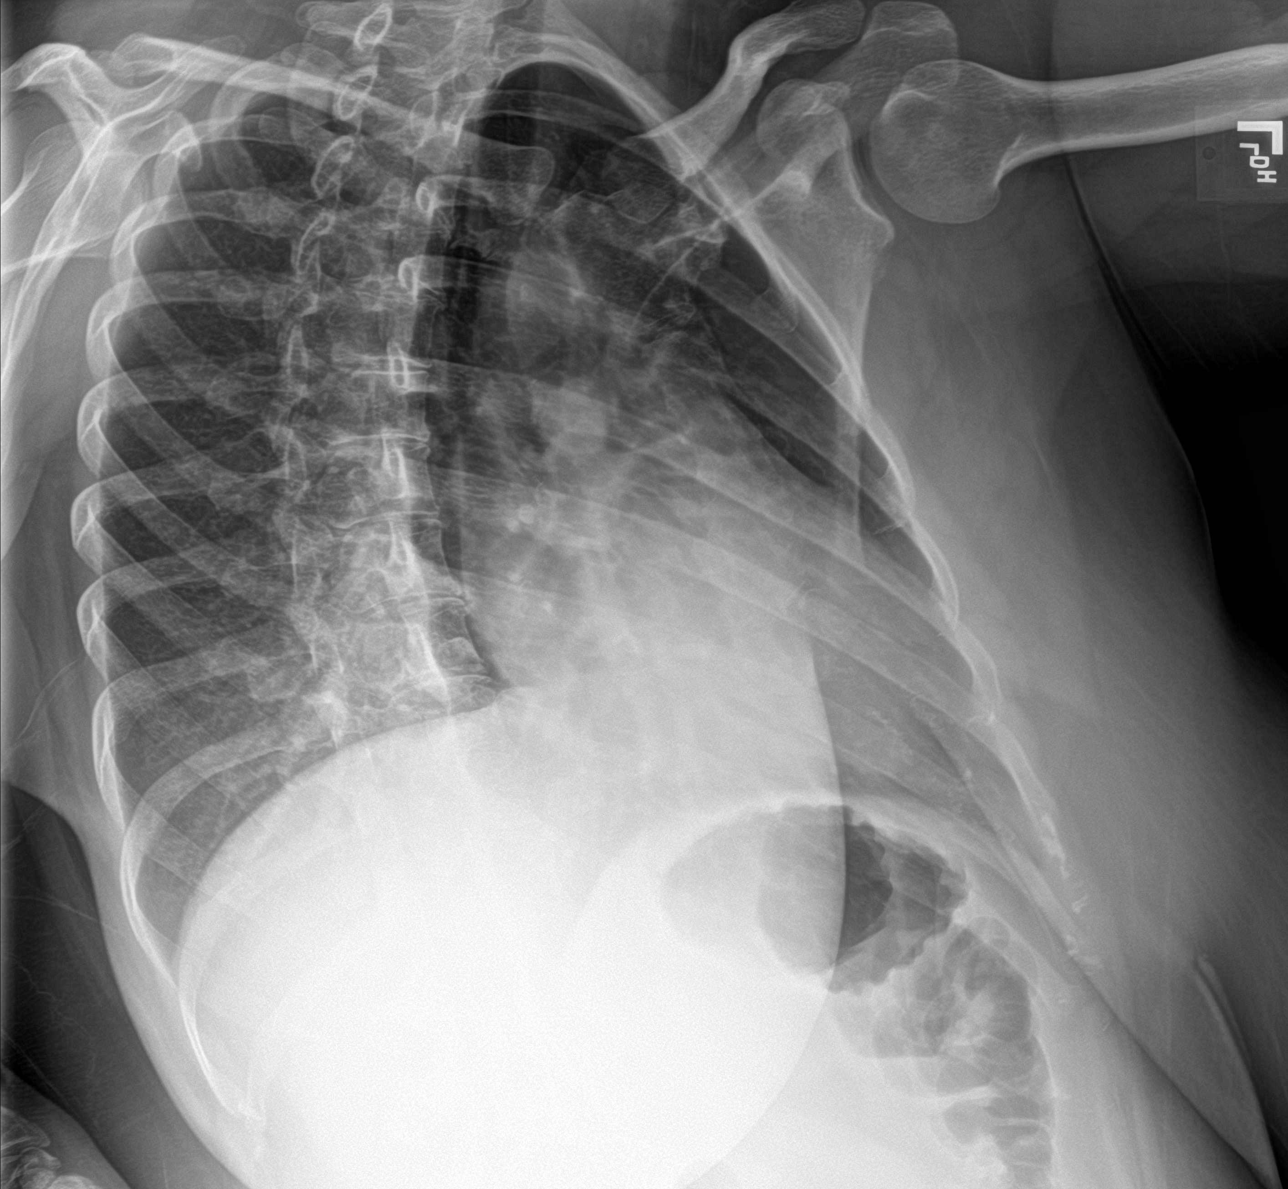

[3 of 3 positions shown; findings below may reference images not displayed]

FINDINGS: No fracture or other bone lesions are seen involving the ribs. There
is no evidence of pneumothorax or pleural effusion. Both lungs are
clear. Heart size and mediastinal contours are within normal limits.
IMPRESSION: Negative.

## 2021-05-22 IMAGING — CR DG HUMERUS 2V *L*
1 series · 2 of 2 positions shown · non-contrast
Comparison: None.

CLINICAL DATA: Pain

EXAM:
LEFT HUMERUS - 2+ VIEW

[Series 1: dg humerus right · 0.14mm/px · 2 of 2 slices shown]
[im 1/2]
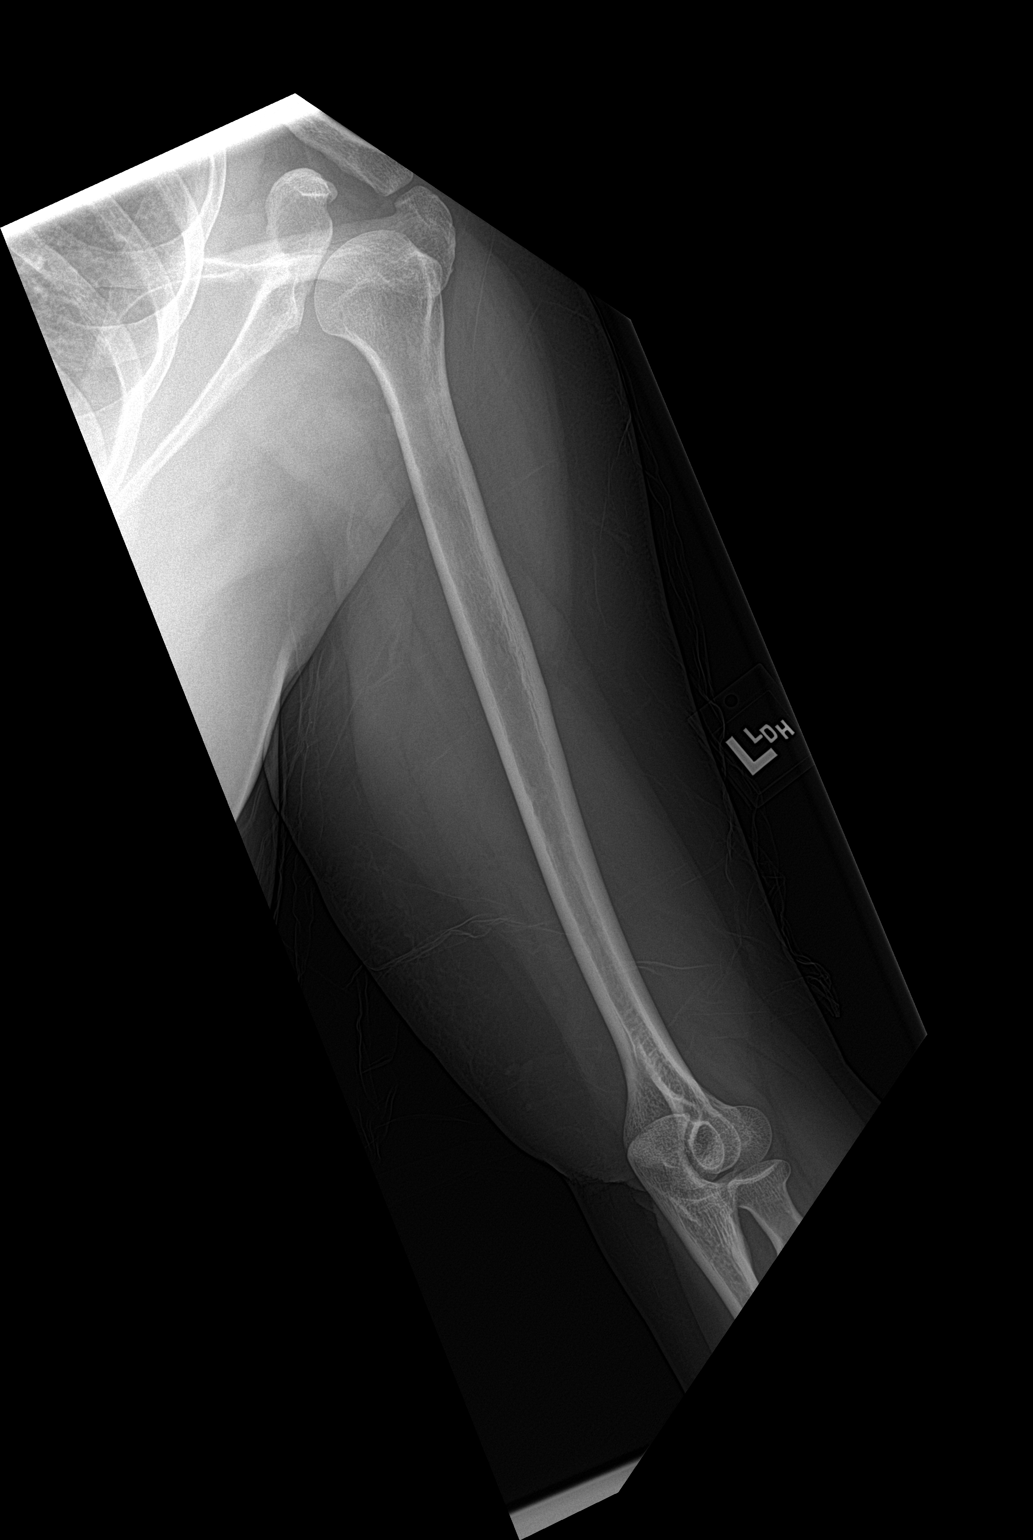
[im 2/2]
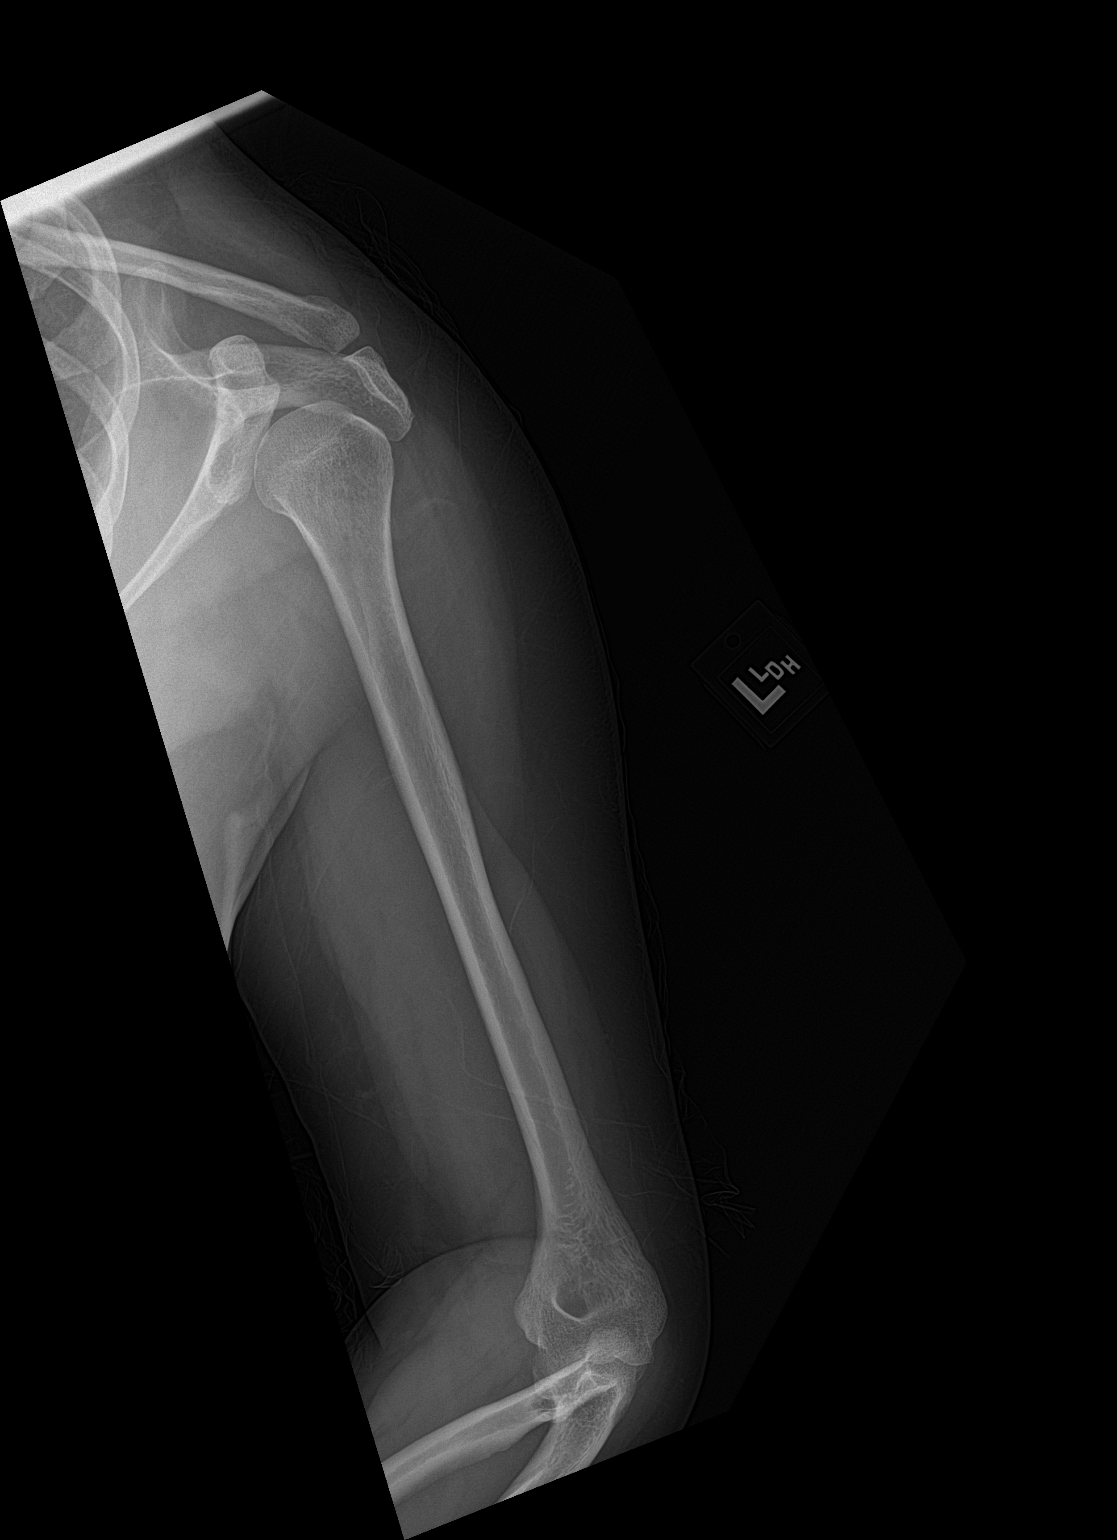

[2 of 2 positions shown; findings below may reference images not displayed]

FINDINGS: There is no evidence of fracture or other focal bone lesions. Soft
tissues are unremarkable.
IMPRESSION: Negative.

## 2021-06-18 ENCOUNTER — Other Ambulatory Visit: Payer: Self-pay

## 2021-06-18 ENCOUNTER — Encounter: Payer: Self-pay | Admitting: Nurse Practitioner

## 2021-06-18 ENCOUNTER — Ambulatory Visit (INDEPENDENT_AMBULATORY_CARE_PROVIDER_SITE_OTHER): Payer: BC Managed Care – PPO | Admitting: Nurse Practitioner

## 2021-06-18 VITALS — BP 107/74 | HR 87 | Temp 99.2°F | Ht 59.0 in | Wt 193.0 lb

## 2021-06-18 DIAGNOSIS — Z8632 Personal history of gestational diabetes: Secondary | ICD-10-CM | POA: Diagnosis not present

## 2021-06-18 DIAGNOSIS — Z0000 Encounter for general adult medical examination without abnormal findings: Secondary | ICD-10-CM | POA: Diagnosis not present

## 2021-06-18 DIAGNOSIS — E7800 Pure hypercholesterolemia, unspecified: Secondary | ICD-10-CM

## 2021-06-18 DIAGNOSIS — E6609 Other obesity due to excess calories: Secondary | ICD-10-CM

## 2021-06-18 DIAGNOSIS — Z6838 Body mass index (BMI) 38.0-38.9, adult: Secondary | ICD-10-CM

## 2021-06-18 DIAGNOSIS — Z6837 Body mass index (BMI) 37.0-37.9, adult: Secondary | ICD-10-CM | POA: Diagnosis not present

## 2021-06-18 NOTE — Progress Notes (Signed)
BP 107/74   Pulse 87   Temp 99.2 F (37.3 C) (Oral)   Ht 4\' 11"  (1.499 m)   Wt 193 lb (87.5 kg)   SpO2 99%   Breastfeeding No   BMI 38.98 kg/m    Subjective:    Patient ID: Shari CrockerMandy D Mccarthy, female    DOB: [birth year], 40 y.o.   MRN: [id number]  HPI: Shari Mccarthy is a 40 y.o. female presenting on 06/18/2021 for comprehensive medical examination. Current medical complaints include:none  She currently lives with: children Menopausal Symptoms: no  Functional Status Survey: Is the patient deaf or have difficulty hearing?: No Does the patient have difficulty seeing, even when wearing glasses/contacts?: No Does the patient have difficulty concentrating, remembering, or making decisions?: No Does the patient have difficulty walking or climbing stairs?: No Does the patient have difficulty dressing or bathing?: No Does the patient have difficulty doing errands alone such as visiting a doctor's office or shopping?: No   Depression Screen done today and results listed below:  [facility] 06/18/2021 05/01/2020 03/24/2020  Decreased Interest 0 1 0  Down, Depressed, Hopeless 0 1 0  PHQ - 2 Score 0 2 0  Altered sleeping - 1 0  Tired, decreased energy - 1 0  Change in appetite - 1 0  Feeling bad or failure about yourself  - 0 0  Trouble concentrating - 0 0  Moving slowly or fidgety/restless - 0 0  Suicidal thoughts - 0 0  PHQ-9 Score - 5 0  Difficult doing work/chores - - Not difficult at all    The patient does not have a history of falls. I did not complete a risk assessment for falls. A plan of care for falls was not documented.   Past Medical History:  Past Medical History:  Diagnosis Date   Gestational diabetes     Surgical History:  History reviewed. No pertinent surgical history.  Medications:  Current Outpatient Medications on File Prior to Visit  Medication Sig   calcium carbonate (TUMS) 500 MG chewable tablet Chew 1 tablet (200 mg of elemental calcium  total) by mouth daily.   HAILEY 24 FE 1-20 MG-MCG(24) tablet TAKE 1 TABLET BY MOUTH EVERY DAY   ibuprofen (ADVIL,MOTRIN) 600 MG tablet Take 1 tablet (600 mg total) by mouth every 6 (six) hours.   Multiple Vitamins-Minerals (WOMENS MULTIVITAMIN PO) Take by mouth.   No current facility-administered medications on file prior to visit.    Allergies:  Allergies  Allergen Reactions   Prednisone Other (See Comments)    Irritability     Social History:  Social History   Socioeconomic History   Marital status: Single    Spouse name: Not on file   Number of children: Not on file   Years of education: Not on file   Highest education level: Not on file  Occupational History   Not on file  Tobacco Use   Smoking status: Never   Smokeless tobacco: Never  Substance and Sexual Activity   Alcohol use: No   Drug use: No   Sexual activity: Not Currently  Other Topics Concern   Not on file  Social History Narrative   Not on file   Social Determinants of Health   Financial Resource Strain: Low Risk    Difficulty of Paying Living Expenses: Not hard at all  Food Insecurity: No Food Insecurity   Worried About Running Out of Food in the Last Year: Never true   Ran  Out of Food in the Last Year: Never true  Transportation Needs: No Transportation Needs   Lack of Transportation (Medical): No   Lack of Transportation (Non-Medical): No  Physical Activity: Insufficiently Active   Days of Exercise per Week: 4 days   Minutes of Exercise per Session: 30 min  Stress: No Stress Concern Present   Feeling of Stress : Only a little  Social Connections: Socially Isolated   Frequency of Communication with Friends and Family: Three times a week   Frequency of Social Gatherings with Friends and Family: Three times a week   Attends Religious Services: Never   Active Member of Clubs or Organizations: No   Attends Banker Meetings: Never   Marital Status: Separated  Intimate Partner  Violence: Not At Risk   Fear of Current or Ex-Partner: No   Emotionally Abused: No   Physically Abused: No   Sexually Abused: No   Social History   Tobacco Use  Smoking Status Never  Smokeless Tobacco Never   Social History   Substance and Sexual Activity  Alcohol Use No    Family History:  Family History  Problem Relation Age of Onset   Heart disease Father    Cancer Maternal Grandfather    Diabetes Paternal Grandfather     Past medical history, surgical history, medications, allergies, family history and social history reviewed with patient today and changes made to appropriate areas of the chart.   Review of Systems - negative All other ROS negative except what is listed above and in the HPI.      Objective:    BP 107/74   Pulse 87   Temp 99.2 F (37.3 C) (Oral)   Ht 4\' 11"  (1.499 m)   Wt 193 lb (87.5 kg)   SpO2 99%   Breastfeeding No   BMI 38.98 kg/m   Wt Readings from Last 3 Encounters:  06/18/21 193 lb (87.5 kg)  12/23/20 195 lb (88.5 kg)  12/08/20 192 lb 6.4 oz (87.3 kg)    Physical Exam Vitals and nursing note reviewed. Exam conducted with a chaperone present.  Constitutional:      General: She is awake. She is not in acute distress.    Appearance: She is well-developed and well-groomed. She is obese. She is not ill-appearing or toxic-appearing.  HENT:     Head: Normocephalic and atraumatic.     Right Ear: Hearing, tympanic membrane, ear canal and external ear normal. No drainage.     Left Ear: Hearing, tympanic membrane, ear canal and external ear normal. No drainage.     Nose: Nose normal.     Right Sinus: No maxillary sinus tenderness or frontal sinus tenderness.     Left Sinus: No maxillary sinus tenderness or frontal sinus tenderness.     Mouth/Throat:     Mouth: Mucous membranes are moist.     Pharynx: Oropharynx is clear. Uvula midline. No pharyngeal swelling, oropharyngeal exudate or posterior oropharyngeal erythema.  Eyes:     General:  Lids are normal.        Right eye: No discharge.        Left eye: No discharge.     Extraocular Movements: Extraocular movements intact.     Conjunctiva/sclera: Conjunctivae normal.     Pupils: Pupils are equal, round, and reactive to light.     Visual Fields: Right eye visual fields normal and left eye visual fields normal.  Neck:     Thyroid: No thyromegaly.  Vascular: No carotid bruit.     Trachea: Trachea normal.  Cardiovascular:     Rate and Rhythm: Normal rate and regular rhythm.     Heart sounds: Normal heart sounds. No murmur heard.   No gallop.  Pulmonary:     Effort: Pulmonary effort is normal. No accessory muscle usage or respiratory distress.     Breath sounds: Normal breath sounds.  Chest:  Breasts:    Right: Normal.     Left: Normal.  Abdominal:     General: Bowel sounds are normal.     Palpations: Abdomen is soft. There is no hepatomegaly or splenomegaly.     Tenderness: There is no abdominal tenderness.  Musculoskeletal:        General: Normal range of motion.     Cervical back: Normal range of motion and neck supple.     Right lower leg: No edema.     Left lower leg: No edema.  Lymphadenopathy:     Head:     Right side of head: No submental, submandibular, tonsillar, preauricular or posterior auricular adenopathy.     Left side of head: No submental, submandibular, tonsillar, preauricular or posterior auricular adenopathy.     Cervical: No cervical adenopathy.     Upper Body:     Right upper body: No supraclavicular, axillary or pectoral adenopathy.     Left upper body: No supraclavicular, axillary or pectoral adenopathy.  Skin:    General: Skin is warm and dry.     Capillary Refill: Capillary refill takes less than 2 seconds.     Findings: No rash.  Neurological:     Mental Status: She is alert and oriented to person, place, and time.     Cranial Nerves: Cranial nerves are intact.     Gait: Gait is intact.     Deep Tendon Reflexes: Reflexes are  normal and symmetric.     Reflex Scores:      Brachioradialis reflexes are 2+ on the right side and 2+ on the left side.      Patellar reflexes are 2+ on the right side and 2+ on the left side. Psychiatric:        Attention and Perception: Attention normal.        Mood and Affect: Mood normal.        Speech: Speech normal.        Behavior: Behavior normal. Behavior is cooperative.        Thought Content: Thought content normal.        Judgment: Judgment normal.   Results for orders placed or performed in visit on 05/01/20  CBC with Differential/Platelet  Result Value Ref Range   WBC 7.2 3.4 - 10.8 x10E3/uL   RBC 4.10 3.77 - 5.28 x10E6/uL   Hemoglobin 13.0 11.1 - 15.9 g/dL   Hematocrit 16.1 09.6 - 46.6 %   MCV 91 79 - 97 fL   MCH 31.7 26.6 - 33.0 pg   MCHC 34.8 31.5 - 35.7 g/dL   RDW 04.5 40.9 - 81.1 %   Platelets 313 150 - 450 x10E3/uL   Neutrophils 60 Not Estab. %   Lymphs 34 Not Estab. %   Monocytes 5 Not Estab. %   Eos 1 Not Estab. %   Basos 0 Not Estab. %   Neutrophils Absolute 4.3 1.4 - 7.0 x10E3/uL   Lymphocytes Absolute 2.4 0.7 - 3.1 x10E3/uL   Monocytes Absolute 0.4 0.1 - 0.9 x10E3/uL   EOS (ABSOLUTE) 0.1 0.0 -  0.4 x10E3/uL   Basophils Absolute 0.0 0.0 - 0.2 x10E3/uL   Immature Granulocytes 0 Not Estab. %   Immature Grans (Abs) 0.0 0.0 - 0.1 x10E3/uL  Comprehensive metabolic panel  Result Value Ref Range   Glucose 101 (H) 65 - 99 mg/dL   BUN 9 6 - 20 mg/dL   Creatinine, Ser 8.52 (L) 0.57 - 1.00 mg/dL   GFR calc non Af Amer 122 >59 mL/min/1.73   GFR calc Af Amer 140 >59 mL/min/1.73   BUN/Creatinine Ratio 17 9 - 23   Sodium 138 134 - 144 mmol/L   Potassium 4.3 3.5 - 5.2 mmol/L   Chloride 102 96 - 106 mmol/L   CO2 21 20 - 29 mmol/L   Calcium 9.0 8.7 - 10.2 mg/dL   Total Protein 7.0 6.0 - 8.5 g/dL   Albumin 4.4 3.8 - 4.8 g/dL   Globulin, Total 2.6 1.5 - 4.5 g/dL   Albumin/Globulin Ratio 1.7 1.2 - 2.2   Bilirubin Total 0.5 0.0 - 1.2 mg/dL   Alkaline  Phosphatase 72 48 - 121 IU/L   AST 18 0 - 40 IU/L   ALT 19 0 - 32 IU/L  VITAMIN D 25 Hydroxy (Vit-D Deficiency, Fractures)  Result Value Ref Range   Vit D, 25-Hydroxy 32.0 30.0 - 100.0 ng/mL  TSH  Result Value Ref Range   TSH 2.260 0.450 - 4.500 uIU/mL  Lipid Panel w/o Chol/HDL Ratio  Result Value Ref Range   Cholesterol, Total 194 100 - 199 mg/dL   Triglycerides 80 0 - 149 mg/dL   HDL 52 >77 mg/dL   VLDL Cholesterol Cal 15 5 - 40 mg/dL   LDL Chol Calc (NIH) 824 (H) 0 - 99 mg/dL  Hepatitis C antibody  Result Value Ref Range   Hep C Virus Ab <0.1 0.0 - 0.9 s/co ratio      Assessment & Plan:   Problem List Items Addressed This Visit       Other   Obesity    BMI 38.98.  Recommended eating smaller high protein, low fat meals more frequently and exercising 30 mins a day 5 times a week with a goal of 10-15lb weight loss in the next 3 months. Patient voiced their understanding and motivation to adhere to these recommendations. LABS: CBC, CMP, TSH today.       Relevant Orders   CBC with Differential/Platelet   TSH   History of gestational diabetes    History of in past -- monitor A1c annually.  On labs today.      Relevant Orders   Comprehensive metabolic panel   HgB A1c   Elevated low density lipoprotein (LDL) cholesterol level - Primary    Noted on past labs, recheck lipid panel today.  She is fasting.  Recommend heavy focus on diet and regular activity.      Relevant Orders   Lipid Panel w/o Chol/HDL Ratio   Other Visit Diagnoses     Annual physical exam       Annual physical exam today with labs: CBC, CMP, TSH, lipid.  Health maintenance reviewed and due for flu vaccine, will obtain in future.        Follow up plan: Return in about 1 year (around 06/18/2022) for Annual physical.   LABORATORY TESTING:  - Pap smear: up to date   IMMUNIZATIONS:   - Tdap: Tetanus vaccination status reviewed: last tetanus booster within 10 years. - Influenza: Up to date -  Pneumovax: Not applicable - Prevnar: Not  applicable - HPV: Not applicable - Zostavax vaccine: Not applicable  SCREENING: -Mammogram: Not applicable  - Colonoscopy: Not applicable  - Bone Density: Not applicable  -Hearing Test: Not applicable  -Spirometry: Not applicable   PATIENT COUNSELING:   Advised to take 1 mg of folate supplement per day if capable of pregnancy.   Sexuality: Discussed sexually transmitted diseases, partner selection, use of condoms, avoidance of unintended pregnancy  and contraceptive alternatives.   Advised to avoid cigarette smoking.  I discussed with the patient that most people either abstain from alcohol or drink within safe limits (<=14/week and <=4 drinks/occasion for males, <=7/weeks and <= 3 drinks/occasion for females) and that the risk for alcohol disorders and other health effects rises proportionally with the number of drinks per week and how often a drinker exceeds daily limits.  Discussed cessation/primary prevention of drug use and availability of treatment for abuse.   Diet: Encouraged to adjust caloric intake to maintain  or achieve ideal body weight, to reduce intake of dietary saturated fat and total fat, to limit sodium intake by avoiding high sodium foods and not adding table salt, and to maintain adequate dietary potassium and calcium preferably from fresh fruits, vegetables, and low-fat dairy products.    Stressed the importance of regular exercise  Injury prevention: Discussed safety belts, safety helmets, smoke detector, smoking near bedding or upholstery.   Dental health: Discussed importance of regular tooth brushing, flossing, and dental visits.    NEXT PREVENTATIVE PHYSICAL DUE IN 1 YEAR. Return in about 1 year (around 06/18/2022) for Annual physical.

## 2021-06-18 NOTE — Assessment & Plan Note (Addendum)
BMI 38.98.  Recommended eating smaller high protein, low fat meals more frequently and exercising 30 mins a day 5 times a week with a goal of 10-15lb weight loss in the next 3 months. Patient voiced their understanding and motivation to adhere to these recommendations. LABS: CBC, CMP, TSH today.

## 2021-06-18 NOTE — Assessment & Plan Note (Signed)
History of in past -- monitor A1c annually.  On labs today. 

## 2021-06-18 NOTE — Assessment & Plan Note (Signed)
Noted on past labs, recheck lipid panel today.  She is fasting.  Recommend heavy focus on diet and regular activity. 

## 2021-06-18 NOTE — Patient Instructions (Signed)

## 2021-06-19 ENCOUNTER — Encounter: Payer: Self-pay | Admitting: Nurse Practitioner

## 2021-06-19 DIAGNOSIS — R7309 Other abnormal glucose: Secondary | ICD-10-CM | POA: Insufficient documentation

## 2021-06-19 LAB — CBC WITH DIFFERENTIAL/PLATELET
Basophils Absolute: 0 10*3/uL (ref 0.0–0.2)
Basos: 0 %
EOS (ABSOLUTE): 0.1 10*3/uL (ref 0.0–0.4)
Eos: 1 %
Hematocrit: 38.3 % (ref 34.0–46.6)
Hemoglobin: 12.6 g/dL (ref 11.1–15.9)
Immature Grans (Abs): 0 10*3/uL (ref 0.0–0.1)
Immature Granulocytes: 0 %
Lymphocytes Absolute: 2.9 10*3/uL (ref 0.7–3.1)
Lymphs: 39 %
MCH: 30.1 pg (ref 26.6–33.0)
MCHC: 32.9 g/dL (ref 31.5–35.7)
MCV: 92 fL (ref 79–97)
Monocytes Absolute: 0.4 10*3/uL (ref 0.1–0.9)
Monocytes: 5 %
Neutrophils Absolute: 4 10*3/uL (ref 1.4–7.0)
Neutrophils: 55 %
Platelets: 378 10*3/uL (ref 150–450)
RBC: 4.18 x10E6/uL (ref 3.77–5.28)
RDW: 12.6 % (ref 11.7–15.4)
WBC: 7.4 10*3/uL (ref 3.4–10.8)

## 2021-06-19 LAB — COMPREHENSIVE METABOLIC PANEL
ALT: 22 IU/L (ref 0–32)
AST: 18 IU/L (ref 0–40)
Albumin/Globulin Ratio: 1.4 (ref 1.2–2.2)
Albumin: 4.1 g/dL (ref 3.8–4.8)
Alkaline Phosphatase: 73 IU/L (ref 44–121)
BUN/Creatinine Ratio: 14 (ref 9–23)
BUN: 8 mg/dL (ref 6–20)
Bilirubin Total: 0.5 mg/dL (ref 0.0–1.2)
CO2: 20 mmol/L (ref 20–29)
Calcium: 8.6 mg/dL — ABNORMAL LOW (ref 8.7–10.2)
Chloride: 100 mmol/L (ref 96–106)
Creatinine, Ser: 0.57 mg/dL (ref 0.57–1.00)
Globulin, Total: 3 g/dL (ref 1.5–4.5)
Glucose: 74 mg/dL (ref 65–99)
Potassium: 4.2 mmol/L (ref 3.5–5.2)
Sodium: 139 mmol/L (ref 134–144)
Total Protein: 7.1 g/dL (ref 6.0–8.5)
eGFR: 119 mL/min/{1.73_m2} (ref >59)

## 2021-06-19 LAB — LIPID PANEL W/O CHOL/HDL RATIO
Cholesterol, Total: 187 mg/dL (ref 100–199)
HDL: 49 mg/dL (ref >39)
LDL Chol Calc (NIH): 124 mg/dL — ABNORMAL HIGH (ref 0–99)
Triglycerides: 73 mg/dL (ref 0–149)
VLDL Cholesterol Cal: 14 mg/dL (ref 5–40)

## 2021-06-19 LAB — HEMOGLOBIN A1C
Est. average glucose Bld gHb Est-mCnc: 123 mg/dL
Hgb A1c MFr Bld: 5.9 % — ABNORMAL HIGH (ref 4.8–5.6)

## 2021-06-19 LAB — TSH: TSH: 1.85 u[IU]/mL (ref 0.450–4.500)

## 2021-06-19 NOTE — Progress Notes (Signed)
Contacted via [place] afternoon [place], your labs have returned: - CBC is normal, showing no anemia - Kidney function, creatinine and eGFR, is normal, as is liver function, AST and ALT. - thyroid level is normal. - The A1C is the diabetes testing we talked about, this looks at your blood sugars over the past 3 months and turns the average into a number.  Your number is 5.9%, meaning you are prediabetic.  Any number 5.7 to 6.4 is considered prediabetes and any number 6.5 or greater is considered diabetes.   I would recommend heavy focus on decreasing foods high in sugar and your intake of things like bread products, pasta, and rice.  The American Diabetes Association online has a large amount of information on diet changes to make.  We will recheck this number at next visit. - Your LDL is above normal. The LDL is the bad cholesterol. Over time and in combination with inflammation and other factors, this contributes to plaque which in turn may lead to stroke and/or heart attack down the road. Sometimes high LDL is primarily genetic, and people might be eating all the right foods but still have high numbers. Other times, there is room for improvement in one's diet and eating healthier can bring this number down and potentially reduce one's risk of heart attack and/or stroke.   To reduce your LDL, Remember - more fruits and vegetables, more fish, and limit red meat and dairy products. More soy, nuts, beans, barley, lentils, oats and plant sterol ester enriched margarine instead of butter. I also encourage eliminating sugar and processed food. Remember, shop on the outside of the grocery store and visit your [employer]. If you would like to talk with me about dietary changes for your cholesterol, please let me know. We should recheck your cholesterol in 12 months.  Any questions on these? Keep being amazing!!  Thank you for allowing me to participate in your care.  I appreciate  you. Kindest regards, Rosezella Kronick

## 2021-08-18 ENCOUNTER — Ambulatory Visit: Payer: BC Managed Care – PPO | Admitting: Nurse Practitioner

## 2021-10-27 ENCOUNTER — Telehealth (INDEPENDENT_AMBULATORY_CARE_PROVIDER_SITE_OTHER): Payer: BC Managed Care – PPO | Admitting: Nurse Practitioner

## 2021-10-27 ENCOUNTER — Other Ambulatory Visit: Payer: Self-pay | Admitting: Nurse Practitioner

## 2021-10-27 ENCOUNTER — Encounter: Payer: Self-pay | Admitting: Nurse Practitioner

## 2021-10-27 ENCOUNTER — Telehealth: Payer: BC Managed Care – PPO | Admitting: Nurse Practitioner

## 2021-10-27 DIAGNOSIS — F419 Anxiety disorder, unspecified: Secondary | ICD-10-CM | POA: Diagnosis not present

## 2021-10-27 MED ORDER — SERTRALINE HCL 25 MG PO TABS: 50.0000 mg | ORAL_TABLET | Freq: Every day | ORAL | 1 refills | Status: DC

## 2021-10-27 MED ORDER — HYDROXYZINE PAMOATE 25 MG PO CAPS: 25.0000 mg | ORAL_CAPSULE | Freq: Three times a day (TID) | ORAL | 0 refills | Status: DC | PRN

## 2021-10-27 NOTE — Telephone Encounter (Signed)
Requested medications are due for refill today.  yes  Requested medications are on the active medications list.  yes  Last refill. 10/27/2020  Future visit scheduled.   yes  Notes to clinic.  Rx returned from pharmacy with note.   "Pharmacy comment: Alternative Requested:INSURANCE WILL NOT PAY FOR 2 TABLETS DAILY WITHOUT PRIOR AUTHORIZATION. PLEASE SEND RX FOR 50 MG TABLET."  Please advise.    Requested Prescriptions  Pending Prescriptions Disp Refills   sertraline (ZOLOFT) 25 MG tablet [Pharmacy Med Name: SERTRALINE HCL 25 MG TABLET] 60 tablet 1    Sig: START WITH 1 TABLET DAILY FOR 7 DAYS, THEN INCREASE TO 2 TABLETS DAILY     Psychiatry:  Antidepressants - SSRI Passed - 10/27/2021  4:06 PM      Passed - Valid encounter within last 6 months    Recent Outpatient Visits           Today Anxiety   [facility] McElwee, Lauren A, NP   4 months ago Elevated low density lipoprotein (LDL) cholesterol level   [facility] [place], Dora T, NP   10 months ago Eustachian tube dysfunction, bilateral   [facility] Cannady, Dorie Rank, NP   1 year ago Routine general medical examination at a health care facility   [facility], Corrie Dandy T, NP   1 year ago Encounter to establish care   [facility], Dorie Rank, NP       Future Appointments             In 7 months Cannady, Dorie Rank, NP [facility]

## 2021-10-27 NOTE — Progress Notes (Signed)
Acute Office Visit  Subjective:    Patient ID: Shari Mccarthy, female    DOB: 10-03-1981, 41 y.o.   MRN: [id number]  Chief Complaint  Patient presents with   Anxiety    Patient states she is here to follow up on Anxiety. Patient states her panic attacks are happening more frequently. Patient states she is constantly feeling overwhelmed and stressed. Patient states when she has her panic attack recently she noticed chest and back tightness. Patient states her anxiety intensifies when it is time for her cycle.     HPI Patient is in today for anxiety and stress that has been worsening over the past few months.  ANXIETY/STRESS  Duration:worse Anxious mood: yes  Excessive worrying: yes Irritability: yes  Sweating: no Nausea: no Palpitations:yes - with panic attacks Hyperventilation: yes - with panic attacks Panic attacks: yes Agoraphobia: no  Obscessions/compulsions: no Depressed mood: yes Depression screen [facility] 2/9 10/27/2021 06/18/2021 05/01/2020 03/24/2020  Decreased Interest 1 0 1 0  Down, Depressed, Hopeless 1 0 1 0  PHQ - 2 Score 2 0 2 0  Altered sleeping 0 - 1 0  Tired, decreased energy 2 - 1 0  Change in appetite 1 - 1 0  Feeling bad or failure about yourself  0 - 0 0  Trouble concentrating 3 - 0 0  Moving slowly or fidgety/restless 0 - 0 0  Suicidal thoughts 1 - 0 0  PHQ-9 Score 9 - 5 0  Difficult doing work/chores Somewhat difficult - - Not difficult at all   GAD 7 : Generalized Anxiety Score 10/27/2021  Nervous, Anxious, on Edge 2  Control/stop worrying 2  Worry too much - different things 2  Trouble relaxing 3  Restless 0  Easily annoyed or irritable 1  Afraid - awful might happen 1  Total GAD 7 Score 11    Anhedonia: no Weight changes: no Insomnia: no   Hypersomnia: no Fatigue/loss of energy: yes Feelings of worthlessness: no Feelings of guilt: no Impaired concentration/indecisiveness: yes Suicidal ideations: no  Crying spells: yes Recent  Stressors/Life Changes: yes - home and work   Past Medical History:  Diagnosis Date   Gestational diabetes     History reviewed. No pertinent surgical history.  Family History  Problem Relation Age of Onset   Heart disease Father    Cancer Maternal Grandfather    Diabetes Paternal Grandfather     Social History   Socioeconomic History   Marital status: Single    Spouse name: Not on file   Number of children: Not on file   Years of education: Not on file   Highest education level: Not on file  Occupational History   Not on file  Tobacco Use   Smoking status: Never   Smokeless tobacco: Never  Substance and Sexual Activity   Alcohol use: No   Drug use: No   Sexual activity: Not Currently  Other Topics Concern   Not on file  Social History Narrative   Not on file   Social Determinants of Health   Financial Resource Strain: Low Risk    Difficulty of Paying Living Expenses: Not hard at all  Food Insecurity: No Food Insecurity   Worried About Charity fundraiser in the Last Year: Never true   [place] in the Last Year: Never true  Transportation Needs: No Transportation Needs   Lack of Transportation (Medical): No   Lack of Transportation (Non-Medical): No  Physical Activity: Insufficiently Active  Days of Exercise per Week: 4 days   Minutes of Exercise per Session: 30 min  Stress: No Stress Concern Present   Feeling of Stress : Only a little  Social Connections: Socially Isolated   Frequency of Communication with Friends and Family: Three times a week   Frequency of Social Gatherings with Friends and Family: Three times a week   Attends Religious Services: Never   Active Member of Clubs or Organizations: No   Attends Archivist Meetings: Never   Marital Status: Separated  Intimate Partner Violence: Not At Risk   Fear of Current or Ex-Partner: No   Emotionally Abused: No   Physically Abused: No   Sexually Abused: No    Outpatient  Medications Prior to Visit  Medication Sig Dispense Refill   calcium carbonate (TUMS) 500 MG chewable tablet Chew 1 tablet (200 mg of elemental calcium total) by mouth daily. 35 tablet 4   HAILEY 24 FE 1-20 MG-MCG(24) tablet TAKE 1 TABLET BY MOUTH EVERY DAY 84 tablet 3   ibuprofen (ADVIL,MOTRIN) 600 MG tablet Take 1 tablet (600 mg total) by mouth every 6 (six) hours. 30 tablet 0   Multiple Vitamins-Minerals (WOMENS MULTIVITAMIN PO) Take by mouth.     No facility-administered medications prior to visit.    Allergies  Allergen Reactions   Prednisone Other (See Comments)    Irritability     Review of Systems  Constitutional:  Positive for fatigue.  Respiratory: Negative.    Cardiovascular: Negative.   Gastrointestinal: Negative.   Psychiatric/Behavioral:  The patient is nervous/anxious.       Objective:    Physical Exam Vitals and nursing note reviewed.  Constitutional:      General: She is not in acute distress.    Appearance: Normal appearance.  HENT:     Head: Normocephalic.  Eyes:     Conjunctiva/sclera: Conjunctivae normal.  Pulmonary:     Effort: Pulmonary effort is normal.     Comments: Able to talk in complete sentences Neurological:     Mental Status: She is alert and oriented to person, place, and time.  Psychiatric:        Mood and Affect: Mood normal.        Behavior: Behavior normal.        Thought Content: Thought content normal.        Judgment: Judgment normal.    There were no vitals taken for this visit. Wt Readings from Last 3 Encounters:  06/18/21 193 lb (87.5 kg)  12/23/20 195 lb (88.5 kg)  12/08/20 192 lb 6.4 oz (87.3 kg)    Health Maintenance Due  Topic Date Due   COVID-19 Vaccine (4 - Booster for Moderna series) 11/07/2020   INFLUENZA VACCINE  05/18/2021    There are no preventive care reminders to display for this patient.   Lab Results  Component Value Date   TSH 1.850 06/18/2021   Lab Results  Component Value Date   WBC 7.4  06/18/2021   HGB 12.6 06/18/2021   HCT 38.3 06/18/2021   MCV 92 06/18/2021   PLT 378 06/18/2021   Lab Results  Component Value Date   NA 139 06/18/2021   K 4.2 06/18/2021   CO2 20 06/18/2021   GLUCOSE 74 06/18/2021   BUN 8 06/18/2021   CREATININE 0.57 06/18/2021   BILITOT 0.5 06/18/2021   ALKPHOS 73 06/18/2021   AST 18 06/18/2021   ALT 22 06/18/2021   PROT 7.1 06/18/2021   ALBUMIN 4.1  06/18/2021   CALCIUM 8.6 (L) 06/18/2021   ANIONGAP 9 02/24/2015   EGFR 119 06/18/2021   Lab Results  Component Value Date   CHOL 187 06/18/2021   Lab Results  Component Value Date   HDL 49 06/18/2021   Lab Results  Component Value Date   LDLCALC 124 (H) 06/18/2021   Lab Results  Component Value Date   TRIG 73 06/18/2021   No results found for: CHOLHDL Lab Results  Component Value Date   HGBA1C 5.9 (H) 06/18/2021       Assessment & Plan:   Problem List Items Addressed This Visit       Other   Anxiety - Primary    Chronic, worsened in the last few months. She notes her symptoms increase around her menses. She endorses crying spells and panic attacks. She has seen a therapist when she was in her teens and has taken zoloft before. She does not remember if she had side effects or how well the medication helped. PHQ 9 is a 9 and GAD7 is 11 today. Currently denies SI/HI. Will start zoloft 32m daily for 1 week, then increase to 579mdaily. Hydroxzyine as needed for panic attacks, may make her sleepy. Follow up in 4-6 weeks or sooner with concerns.       Relevant Medications   sertraline (ZOLOFT) 25 MG tablet   hydrOXYzine (VISTARIL) 25 MG capsule     Meds ordered this encounter  Medications   sertraline (ZOLOFT) 25 MG tablet    Sig: Take 2 tablets (50 mg total) by mouth daily. Start with 1 tablet daily for 7 days, then increase to 2 tablets daily    Dispense:  60 tablet    Refill:  1   hydrOXYzine (VISTARIL) 25 MG capsule    Sig: Take 1 capsule (25 mg total) by mouth every  8 (eight) hours as needed for anxiety.    Dispense:  30 capsule    Refill:  0    This visit was completed via MyChart due to the restrictions of the COVID-19 pandemic. All issues as above were discussed and addressed. Physical exam was done as above through visual confirmation on MyChart. If it was felt that the patient should be evaluated in the office, they were directed there. The patient verbally consented to this visit. Location of the patient: home Location of the provider: work Those involved with this call:  Provider: LaVance PeperNP CMA:  DeIrena ReichmannCMWewahitchkaesk/Registration: CaMyrlene BrokerTime spent on call:  15 minutes with patient face to face via video conference. More than 50% of this time was spent in counseling and coordination of care. 10 minutes total spent in review of patient's record and preparation of their chart.   LaCharyl DancerNP

## 2021-10-27 NOTE — Patient Instructions (Signed)
It was great to see you!  Let's start zoloft 25mg  (1 tablet) daily for 7 days, then increase to 50mg  (2 tablets) daily. You can take hydroxyzine every 8 hours as needed for panic attacks. This medication may make you sleepy, do not take for the first time at work or while driving.   Let's follow-up in 4-6 weeks, sooner if you have concerns.  Take care,  Vance Peper, NP

## 2021-10-27 NOTE — Assessment & Plan Note (Signed)
Chronic, worsened in the last few months. She notes her symptoms increase around her menses. She endorses crying spells and panic attacks. She has seen a therapist when she was in her teens and has taken zoloft before. She does not remember if she had side effects or how well the medication helped. PHQ 9 is a 9 and GAD7 is 11 today. Currently denies SI/HI. Will start zoloft 25mg  daily for 1 week, then increase to 50mg  daily. Hydroxzyine as needed for panic attacks, may make her sleepy. Follow up in 4-6 weeks or sooner with concerns.

## 2021-10-28 ENCOUNTER — Other Ambulatory Visit: Payer: Self-pay | Admitting: Nurse Practitioner

## 2021-10-28 MED ORDER — SERTRALINE HCL 50 MG PO TABS: 50.0000 mg | ORAL_TABLET | Freq: Every day | ORAL | 4 refills | Status: DC

## 2021-11-19 ENCOUNTER — Other Ambulatory Visit: Payer: Self-pay | Admitting: Nurse Practitioner

## 2021-11-19 NOTE — Telephone Encounter (Signed)
Requested medication (s) are due for refill today - no  Requested medication (s) are on the active medication list -yes  Future visit scheduled -yes  Last refill: 10/28/21 #30 4RF  Notes to clinic: Requesting 90 day supply- non delegated Rx- requires changes to Rx  Requested Prescriptions  Pending Prescriptions Disp Refills   sertraline (ZOLOFT) 50 MG tablet [Pharmacy Med Name: SERTRALINE HCL 50 MG TABLET] 90 tablet 2    Sig: TAKE 1 TABLET BY MOUTH EVERY DAY     Not Delegated - Psychiatry:  Antidepressants - SSRI - sertraline Failed - 11/19/2021 12:33 PM      Failed - This refill cannot be delegated      Passed - AST in normal range and within 360 days    AST  Date Value Ref Range Status  06/18/2021 18 0 - 40 IU/L Final   SGOT(AST)  Date Value Ref Range Status  02/13/2015 23 U/L Final    Comment:    15-41 NOTE: New Reference Range  12/24/14           Passed - ALT in normal range and within 360 days    ALT  Date Value Ref Range Status  06/18/2021 22 0 - 32 IU/L Final   SGPT (ALT)  Date Value Ref Range Status  12/15/2013 49 12 - 78 U/L Final          Passed - Completed PHQ-2 or PHQ-9 in the last 360 days      Passed - Valid encounter within last 6 months    Recent Outpatient Visits           3 weeks ago Anxiety   [facility] McElwee, Lauren A, NP   5 months ago Elevated low density lipoprotein (LDL) cholesterol level   [facility] [place], [place] T, NP   11 months ago Eustachian tube dysfunction, bilateral   [facility] [place], [place] T, NP   1 year ago Routine general medical examination at a health care facility   [facility] [place], Alva T, NP   1 year ago Encounter to establish care   [facility] [place], Dorie Rank, NP       Future Appointments             In 6 days Cannady, Dorie Rank, NP [facility]   In 7 months [place], [place] T, NP [facility]                Requested Prescriptions  Pending Prescriptions Disp Refills   sertraline (ZOLOFT) 50 MG tablet [Pharmacy Med Name: SERTRALINE HCL 50 MG TABLET] 90 tablet 2    Sig: TAKE 1 TABLET BY MOUTH EVERY DAY     Not Delegated - Psychiatry:  Antidepressants - SSRI - sertraline Failed - 11/19/2021 12:33 PM      Failed - This refill cannot be delegated      Passed - AST in normal range and within 360 days    AST  Date Value Ref Range Status  06/18/2021 18 0 - 40 IU/L Final   SGOT(AST)  Date Value Ref Range Status  02/13/2015 23 U/L Final    Comment:    15-41 NOTE: New Reference Range  12/24/14           Passed - ALT in normal range and within 360 days    ALT  Date Value Ref Range Status  06/18/2021 22 0 - 32 IU/L Final  SGPT (ALT)  Date Value Ref Range Status  12/15/2013 49 12 - 78 U/L Final          Passed - Completed PHQ-2 or PHQ-9 in the last 360 days      Passed - Valid encounter within last 6 months    Recent Outpatient Visits           3 weeks ago Anxiety   [facility] McElwee, Lauren A, NP   5 months ago Elevated low density lipoprotein (LDL) cholesterol level   [facility] [place], [place] T, NP   11 months ago Eustachian tube dysfunction, bilateral   [facility] Cannady, Dorie Rank, NP   1 year ago Routine general medical examination at a health care facility   [facility], Corrie Dandy T, NP   1 year ago Encounter to establish care   [facility] [place], Dorie Rank, NP       Future Appointments             In 6 days Cannady, Dorie Rank, NP [facility]   In 7 months [place], Dorie Rank, NP [facility]

## 2021-11-25 ENCOUNTER — Telehealth: Payer: BC Managed Care – PPO | Admitting: Nurse Practitioner

## 2021-12-13 NOTE — Patient Instructions (Signed)
Managing Anxiety, Adult ?After being diagnosed with anxiety, you may be relieved to know why you have felt or behaved a certain way. You may also feel overwhelmed about the treatment ahead and what it will mean for your life. With care and support, you can manage this condition. ?How to manage lifestyle changes ?Managing stress and anxiety ?Stress is your body's reaction to life changes and events, both good and bad. Most stress will last just a few hours, but stress can be ongoing and can lead to more than just stress. Although stress can play a major role in anxiety, it is not the same as anxiety. Stress is usually caused by something external, such as a deadline, test, or competition. Stress normally passes after the triggering event has ended.  ?Anxiety is caused by something internal, such as imagining a terrible outcome or worrying that something will go wrong that will devastate you. Anxiety often does not go away even after the triggering event is over, and it can become long-term (chronic) worry. It is important to understand the differences between stress and anxiety and to manage your stress effectively so that it does not lead to an anxious response. ?Talk with your health care provider or a counselor to learn more about reducing anxiety and stress. He or she may suggest tension reduction techniques, such as: ?Music therapy. Spend time creating or listening to music that you enjoy and that inspires you. ?Mindfulness-based meditation. Practice being aware of your normal breaths while not trying to control your breathing. It can be done while sitting or walking. ?Centering prayer. This involves focusing on a word, phrase, or sacred image that means something to you and brings you peace. ?Deep breathing. To do this, expand your stomach and inhale slowly through your nose. Hold your breath for 3-5 seconds. Then exhale slowly, letting your stomach muscles relax. ?Self-talk. Learn to notice and identify  thought patterns that lead to anxiety reactions and change those patterns to thoughts that feel peaceful. ?Muscle relaxation. Taking time to tense muscles and then relax them. ?Choose a tension reduction technique that fits your lifestyle and personality. These techniques take time and practice. Set aside 5-15 minutes a day to do them. Therapists can offer counseling and training in these techniques. The training to help with anxiety may be covered by some insurance plans. ?Other things you can do to manage stress and anxiety include: ?Keeping a stress diary. This can help you learn what triggers your reaction and then learn ways to manage your response. ?Thinking about how you react to certain situations. You may not be able to control everything, but you can control your response. ?Making time for activities that help you relax and not feeling guilty about spending your time in this way. ?Doing visual imagery. This involves imagining or creating mental pictures to help you relax. ?Practicing yoga. Through yoga poses, you can lower tension and promote relaxation. ? ?Medicines ?Medicines can help ease symptoms. Medicines for anxiety include: ?Antidepressant medicines. These are usually prescribed for long-term daily control. ?Anti-anxiety medicines. These may be added in severe cases, especially when panic attacks occur. ?Medicines will be prescribed by a health care provider. When used together, medicines, psychotherapy, and tension reduction techniques may be the most effective treatment. ?Relationships ?Relationships can play a big part in helping you recover. Try to spend more time connecting with trusted friends and family members. ?Consider going to couples counseling if you have a partner, taking family education classes, or going to family   therapy. ?Therapy can help you and others better understand your condition. ?How to recognize changes in your anxiety ?Everyone responds differently to treatment for  anxiety. Recovery from anxiety happens when symptoms decrease and stop interfering with your daily activities at home or work. This may mean that you will start to: ?Have better concentration and focus. Worry will interfere less in your daily thinking. ?Sleep better. ?Be less irritable. ?Have more energy. ?Have improved memory. ?It is also important to recognize when your condition is getting worse. Contact your health care provider if your symptoms interfere with home or work and you feel like your condition is not improving. ?Follow these instructions at home: ?Activity ?Exercise. Adults should do the following: ?Exercise for at least 150 minutes each week. The exercise should increase your heart rate and make you sweat (moderate-intensity exercise). ?Strengthening exercises at least twice a week. ?Get the right amount and quality of sleep. Most adults need 7-9 hours of sleep each night. ?Lifestyle ? ?Eat a healthy diet that includes plenty of vegetables, fruits, whole grains, low-fat dairy products, and lean protein. ?Do not eat a lot of foods that are high in fats, added sugars, or salt (sodium). ?Make choices that simplify your life. ?Do not use any products that contain nicotine or tobacco. These products include cigarettes, chewing tobacco, and vaping devices, such as e-cigarettes. If you need help quitting, ask your health care provider. ?Avoid caffeine, alcohol, and certain over-the-counter cold medicines. These may make you feel worse. Ask your pharmacist which medicines to avoid. ?General instructions ?Take over-the-counter and prescription medicines only as told by your health care provider. ?Keep all follow-up visits. This is important. ?Where to find support ?You can get help and support from these sources: ?Self-help groups. ?Online and community organizations. ?A trusted spiritual leader. ?Couples counseling. ?Family education classes. ?Family therapy. ?Where to find more information ?You may find  that joining a support group helps you deal with your anxiety. The following sources can help you locate counselors or support groups near you: ?Mental Health America: www.mentalhealthamerica.net ?Anxiety and Depression Association of America (ADAA): www.adaa.org ?National Alliance on Mental Illness (NAMI): www.nami.org ?Contact a health care provider if: ?You have a hard time staying focused or finishing daily tasks. ?You spend many hours a day feeling worried about everyday life. ?You become exhausted by worry. ?You start to have headaches or frequently feel tense. ?You develop chronic nausea or diarrhea. ?Get help right away if: ?You have a racing heart and shortness of breath. ?You have thoughts of hurting yourself or others. ?If you ever feel like you may hurt yourself or others, or have thoughts about taking your own life, get help right away. Go to your nearest emergency department or: ?Call your local emergency services (911 in the [place] ?Call a suicide crisis helpline, such as the National Suicide Prevention Lifeline at 1-800-273-8255 or 988 in the [place] This is open 24 hours a day in the [place] ?Text the Crisis Text Line at 741741 (in the [place] ?Summary ?Taking steps to learn and use tension reduction techniques can help calm you and help prevent triggering an anxiety reaction. ?When used together, medicines, psychotherapy, and tension reduction techniques may be the most effective treatment. ?Family, friends, and partners can play a big part in supporting you. ?This information is not intended to replace advice given to you by your health care provider. Make sure you discuss any questions you have with your health care provider. ?Document Revised: 04/29/2021 Document Reviewed: 01/25/2021 ?Elsevier Patient   Education ? 2022 Elsevier Inc. ? ?

## 2021-12-16 ENCOUNTER — Telehealth (INDEPENDENT_AMBULATORY_CARE_PROVIDER_SITE_OTHER): Payer: BC Managed Care – PPO | Admitting: Nurse Practitioner

## 2021-12-16 ENCOUNTER — Encounter: Payer: Self-pay | Admitting: Nurse Practitioner

## 2021-12-16 DIAGNOSIS — F419 Anxiety disorder, unspecified: Secondary | ICD-10-CM | POA: Diagnosis not present

## 2021-12-16 NOTE — Progress Notes (Signed)
? ?There were no vitals taken for this visit.  ? ?Subjective:  ? ? Patient ID: Shari Mccarthy, female    DOB: 1981/06/21, 41 y.o.   MRN: [id number] ? ?HPI: ?Shari Mccarthy is a 41 y.o. female ? ?Chief Complaint  ?Patient presents with  ? Anxiety  ?  Patient is here for follow up on Anxiety. Patient states so far she says the medication is helping her. Patient denies having any concerns at today's visit.   ? ?This visit was completed via video visit through MyChart due to the restrictions of the COVID-19 pandemic. All issues as above were discussed and addressed. Physical exam was done as above through visual confirmation on video through MyChart. If it was felt that the patient should be evaluated in the office, they were directed there. The patient verbally consented to this visit. ?Location of the patient: home ?Location of the provider: work ?Those involved with this call:  ?Provider: Marnee Guarneri, DNP ?CMA: Irena Reichmann, CMA ?Front Desk/Registration: [employer]  ?Time spent on call:  21 minutes with patient face to face via video conference. More than 50% of this time was spent in counseling and coordination of care. 15 minutes total spent in review of patient's record and preparation of their chart.  ?I verified patient identity using two factors (patient name and date of birth). Patient consents verbally to being seen via telemedicine visit today.   ? ?ANXIETY/STRESS ?Started on Zoloft 50 MG daily and Vistaril at visit on 10/27/21.  She reports the Zoloft is helping, does not notice any ADR with this.  Is taking 50 MG daily of Zoloft, has not had to use Vistaril.   ?Duration:stable ?Anxious mood: no  ?Excessive worrying: no ?Irritability: no  ?Sweating: no ?Nausea: no ?Palpitations:no ?Hyperventilation: no ?Panic attacks: no -- vastly improved ?Agoraphobia: no  ?Obscessions/compulsions: no ?Depressed mood: yes ?Depression screen [facility] 2/9 12/16/2021 10/27/2021 06/18/2021 05/01/2020 03/24/2020  ?Decreased Interest  1 1 0 1 0  ?Down, Depressed, Hopeless 1 1 0 1 0  ?PHQ - 2 Score 2 2 0 2 0  ?Altered sleeping 1 0 - 1 0  ?Tired, decreased energy 1 2 - 1 0  ?Change in appetite 0 1 - 1 0  ?Feeling bad or failure about yourself  0 0 - 0 0  ?Trouble concentrating 0 3 - 0 0  ?Moving slowly or fidgety/restless 0 0 - 0 0  ?Suicidal thoughts 0 1 - 0 0  ?PHQ-9 Score 4 9 - 5 0  ?Difficult doing work/chores Not difficult at all Somewhat difficult - - Not difficult at all  ?Anhedonia: no ?Weight changes: no ?Insomnia: none ?Hypersomnia: no ?Fatigue/loss of energy: no ?Feelings of worthlessness: no ?Feelings of guilt: no ?Impaired concentration/indecisiveness: no ?Suicidal ideations: no  ?Crying spells: yes -- one time in past month, improved ?Recent Stressors/Life Changes: yes ?  Relationship problems: no ?  Family stress: no   ?  Financial stress: no  ?  Job stress: yes -- lots of changes at work ?  Recent death/loss: no  ?GAD 7 : Generalized Anxiety Score 12/16/2021 10/27/2021  ?Nervous, Anxious, on Edge 1 2  ?Control/stop worrying 1 2  ?Worry too much - different things 0 2  ?Trouble relaxing 0 3  ?Restless 0 0  ?Easily annoyed or irritable 0 1  ?Afraid - awful might happen 0 1  ?Total GAD 7 Score 2 11  ?Anxiety Difficulty Not difficult at all -  ? ?Relevant past medical, surgical, family  and social history reviewed and updated as indicated. Interim medical history since our last visit reviewed. ?Allergies and medications reviewed and updated. ? ?Review of Systems  ?Constitutional:  Negative for activity change, appetite change, diaphoresis, fatigue and fever.  ?Respiratory:  Negative for cough, chest tightness and shortness of breath.   ?Cardiovascular:  Negative for chest pain, palpitations and leg swelling.  ?Gastrointestinal: Negative.   ?Endocrine: Negative.   ?Neurological: Negative.   ?Psychiatric/Behavioral: Negative.    ? ?Per HPI unless specifically indicated above ? ?   ?Objective:  ?  ?There were no vitals taken for this visit.   ?Wt Readings from Last 3 Encounters:  ?06/18/21 193 lb (87.5 kg)  ?12/23/20 195 lb (88.5 kg)  ?12/08/20 192 lb 6.4 oz (87.3 kg)  ?  ?Physical Exam ?Vitals and nursing note reviewed.  ?Constitutional:   ?   General: She is awake. She is not in acute distress. ?   Appearance: She is well-developed. She is not ill-appearing.  ?HENT:  ?   Head: Normocephalic.  ?   Right Ear: Hearing normal.  ?   Left Ear: Hearing normal.  ?Eyes:  ?   General: Lids are normal.     ?   Right eye: No discharge.     ?   Left eye: No discharge.  ?   Conjunctiva/sclera: Conjunctivae normal.  ?Pulmonary:  ?   Effort: Pulmonary effort is normal. No accessory muscle usage or respiratory distress.  ?Musculoskeletal:  ?   Cervical back: Normal range of motion.  ?Neurological:  ?   Mental Status: She is alert and oriented to person, place, and time.  ?Psychiatric:     ?   Attention and Perception: Attention normal.     ?   Mood and Affect: Mood normal.     ?   Behavior: Behavior normal. Behavior is cooperative.     ?   Thought Content: Thought content normal.     ?   Judgment: Judgment normal.  ? ? ?Results for orders placed or performed in visit on 06/18/21  ?CBC with Differential/Platelet  ?Result Value Ref Range  ? WBC 7.4 3.4 - 10.8 x10E3/uL  ? RBC 4.18 3.77 - 5.28 x10E6/uL  ? Hemoglobin 12.6 11.1 - 15.9 g/dL  ? Hematocrit 38.3 34.0 - 46.6 %  ? MCV 92 79 - 97 fL  ? MCH 30.1 26.6 - 33.0 pg  ? MCHC 32.9 31.5 - 35.7 g/dL  ? RDW 12.6 11.7 - 15.4 %  ? Platelets 378 150 - 450 x10E3/uL  ? Neutrophils 55 Not Estab. %  ? Lymphs 39 Not Estab. %  ? Monocytes 5 Not Estab. %  ? Eos 1 Not Estab. %  ? Basos 0 Not Estab. %  ? Neutrophils Absolute 4.0 1.4 - 7.0 x10E3/uL  ? Lymphocytes Absolute 2.9 0.7 - 3.1 x10E3/uL  ? Monocytes Absolute 0.4 0.1 - 0.9 x10E3/uL  ? EOS (ABSOLUTE) 0.1 0.0 - 0.4 x10E3/uL  ? Basophils Absolute 0.0 0.0 - 0.2 x10E3/uL  ? Immature Granulocytes 0 Not Estab. %  ? Immature Grans (Abs) 0.0 0.0 - 0.1 x10E3/uL  ?Comprehensive metabolic  panel  ?Result Value Ref Range  ? Glucose 74 65 - 99 mg/dL  ? BUN 8 6 - 20 mg/dL  ? Creatinine, Ser 0.57 0.57 - 1.00 mg/dL  ? eGFR 119 >59 mL/min/1.73  ? BUN/Creatinine Ratio 14 9 - 23  ? Sodium 139 134 - 144 mmol/L  ? Potassium 4.2 3.5 - 5.2 mmol/L  ?  Chloride 100 96 - 106 mmol/L  ? CO2 20 20 - 29 mmol/L  ? Calcium 8.6 (L) 8.7 - 10.2 mg/dL  ? Total Protein 7.1 6.0 - 8.5 g/dL  ? Albumin 4.1 3.8 - 4.8 g/dL  ? Globulin, Total 3.0 1.5 - 4.5 g/dL  ? Albumin/Globulin Ratio 1.4 1.2 - 2.2  ? Bilirubin Total 0.5 0.0 - 1.2 mg/dL  ? Alkaline Phosphatase 73 44 - 121 IU/L  ? AST 18 0 - 40 IU/L  ? ALT 22 0 - 32 IU/L  ?Lipid Panel w/o Chol/HDL Ratio  ?Result Value Ref Range  ? Cholesterol, Total 187 100 - 199 mg/dL  ? Triglycerides 73 0 - 149 mg/dL  ? HDL 49 >39 mg/dL  ? VLDL Cholesterol Cal 14 5 - 40 mg/dL  ? LDL Chol Calc (NIH) 124 (H) 0 - 99 mg/dL  ?TSH  ?Result Value Ref Range  ? TSH 1.850 0.450 - 4.500 uIU/mL  ?HgB A1c  ?Result Value Ref Range  ? Hgb A1c MFr Bld 5.9 (H) 4.8 - 5.6 %  ? Est. average glucose Bld gHb Est-mCnc 123 mg/dL  ? ?   ?Assessment & Plan:  ? ?Problem List Items Addressed This Visit   ? ?  ? Other  ? Anxiety  ?  Chronic, stable at this time.  Tolerating Zoloft well and seeing vast improvement in scores.  Denies SI/HI.  Continue Zoloft 50 MG at this time and Vistaril as needed, adjust dosing as needed.  Return in September for physical, sooner if mood changes present. ?  ?  ?  ?I discussed the assessment and treatment plan with the patient. The patient was provided an opportunity to ask questions and all were answered. The patient agreed with the plan and demonstrated an understanding of the instructions. ? ? ?The patient was advised to call back or seek an in-person evaluation if the symptoms worsen or if the condition fails to improve as anticipated. ? ? ?I provided 21+ minutes of time during this encounter.  ? ?Follow up plan: ?Return for as scheduled in September. ? ? ? ? ? ?

## 2021-12-16 NOTE — Assessment & Plan Note (Signed)
Chronic, stable at this time.  Tolerating Zoloft well and seeing vast improvement in scores.  Denies SI/HI.  Continue Zoloft 50 MG at this time and Vistaril as needed, adjust dosing as needed.  Return in September for physical, sooner if mood changes present. ?

## 2022-04-30 ENCOUNTER — Other Ambulatory Visit: Payer: Self-pay | Admitting: Nurse Practitioner

## 2022-04-30 NOTE — Telephone Encounter (Signed)
Sertraline was sent to pharmacy on 12/10/21 #90/4 Requested Prescriptions  Pending Prescriptions Disp Refills  . sertraline (ZOLOFT) 50 MG tablet [Pharmacy Med Name: SERTRALINE HCL 50 MG TABLET] 90 tablet 4    Sig: TAKE 1 TABLET BY MOUTH EVERY DAY     Psychiatry:  Antidepressants - SSRI - sertraline Passed - 04/30/2022  5:35 PM      Passed - AST in normal range and within 360 days    AST  Date Value Ref Range Status  06/18/2021 18 0 - 40 IU/L Final   SGOT(AST)  Date Value Ref Range Status  02/13/2015 23 U/L Final    Comment:    15-41 NOTE: New Reference Range  12/24/14          Passed - ALT in normal range and within 360 days    ALT  Date Value Ref Range Status  06/18/2021 22 0 - 32 IU/L Final   SGPT (ALT)  Date Value Ref Range Status  12/15/2013 49 12 - 78 U/L Final         Passed - Completed PHQ-2 or PHQ-9 in the last 360 days      Passed - Valid encounter within last 6 months    Recent Outpatient Visits          4 months ago Anxiety   [facility] [place], Dorie Rank, NP   6 months ago Anxiety   [facility] [place], Lauren A, NP   10 months ago Elevated low density lipoprotein (LDL) cholesterol level   [facility] [place], [place] T, NP   1 year ago Eustachian tube dysfunction, bilateral   [facility] Cannady, Jolene T, NP   1 year ago Routine general medical examination at a health care facility   [facility], Dorie Rank, NP      Future Appointments            In 1 month Cannady, Dorie Rank, NP [facility], [facility]           . HAILEY 24 FE 1-20 MG-MCG(24) tablet [Pharmacy Med Name: HAILEY 24 FE 1 MG-20 MCG TAB] 84 tablet 3    Sig: TAKE 1 TABLET BY MOUTH EVERY DAY     OB/GYN:  Contraceptives Passed - 04/30/2022  5:35 PM      Passed - Last BP in normal range    BP Readings from Last 1 Encounters:  06/18/21 107/74         Passed - Valid encounter within last 12 months    Recent  Outpatient Visits          4 months ago Anxiety   [facility] [place], [place] T, NP   6 months ago Anxiety   [facility] [place], Lauren A, NP   10 months ago Elevated low density lipoprotein (LDL) cholesterol level   [facility] [place], [place] T, NP   1 year ago Eustachian tube dysfunction, bilateral   [facility] Cannady, Dorie Rank, NP   1 year ago Routine general medical examination at a health care facility   [facility], Dorie Rank, NP      Future Appointments            In 1 month Cannady, Dorie Rank, NP [facility], [facility]           Passed - Patient is not a smoker

## 2022-06-22 ENCOUNTER — Encounter: Payer: BC Managed Care – PPO | Admitting: Nurse Practitioner

## 2022-07-16 ENCOUNTER — Encounter: Payer: BC Managed Care – PPO | Admitting: Nurse Practitioner

## 2022-07-16 DIAGNOSIS — Z124 Encounter for screening for malignant neoplasm of cervix: Secondary | ICD-10-CM

## 2022-07-16 DIAGNOSIS — E559 Vitamin D deficiency, unspecified: Secondary | ICD-10-CM

## 2022-07-16 DIAGNOSIS — E7800 Pure hypercholesterolemia, unspecified: Secondary | ICD-10-CM

## 2022-07-16 DIAGNOSIS — Z8632 Personal history of gestational diabetes: Secondary | ICD-10-CM

## 2022-07-16 DIAGNOSIS — F419 Anxiety disorder, unspecified: Secondary | ICD-10-CM

## 2022-07-16 DIAGNOSIS — Z0000 Encounter for general adult medical examination without abnormal findings: Secondary | ICD-10-CM

## 2022-07-16 DIAGNOSIS — E6609 Other obesity due to excess calories: Secondary | ICD-10-CM

## 2022-07-16 DIAGNOSIS — R7309 Other abnormal glucose: Secondary | ICD-10-CM

## 2022-07-20 ENCOUNTER — Other Ambulatory Visit: Payer: Self-pay | Admitting: Nurse Practitioner

## 2022-07-20 NOTE — Telephone Encounter (Signed)
Requested Prescriptions  Pending Prescriptions Disp Refills  . sertraline (ZOLOFT) 50 MG tablet [Pharmacy Med Name: SERTRALINE HCL 50 MG TABLET] 90 tablet 4    Sig: TAKE 1 TABLET BY MOUTH EVERY DAY     Psychiatry:  Antidepressants - SSRI - sertraline Failed - 07/20/2022  8:36 AM      Failed - AST in normal range and within 360 days    AST  Date Value Ref Range Status  06/18/2021 18 0 - 40 IU/L Final   SGOT(AST)  Date Value Ref Range Status  02/13/2015 23 U/L Final    Comment:    15-41 NOTE: New Reference Range  12/24/14          Failed - ALT in normal range and within 360 days    ALT  Date Value Ref Range Status  06/18/2021 22 0 - 32 IU/L Final   SGPT (ALT)  Date Value Ref Range Status  12/15/2013 49 12 - 78 U/L Final         Failed - Valid encounter within last 6 months    Recent Outpatient Visits          7 months ago [place], Barbaraann Faster, NP   8 months ago [place], Lauren A, NP   1 year ago Elevated low density lipoprotein (LDL) cholesterol level   [facility], [place] T, NP   1 year ago Eustachian tube dysfunction, bilateral   [place] Cannady, Jolene T, NP   2 years ago Routine general medical examination at a health care facility   [place], Barbaraann Faster, NP      Future Appointments            In 2 weeks Cannady, Barbaraann Faster, NP [facility], [facility]           Passed - Completed PHQ-2 or PHQ-9 in the last 360 days

## 2022-08-01 NOTE — Patient Instructions (Signed)
Influenza (Flu) Vaccine (Inactivated or Recombinant): What You Need to Know 1. Why get vaccinated? Influenza vaccine can prevent influenza (flu). Flu is a contagious disease that spreads around the [place] every year, usually between October and May. Anyone can get the flu, but it is more dangerous for some people. Infants and young children, people 65 years and older, pregnant people, and people with certain health conditions or a weakened immune system are at greatest risk of flu complications. Pneumonia, bronchitis, sinus infections, and ear infections are examples of flu-related complications. If you have a medical condition, such as heart disease, cancer, or diabetes, flu can make it worse. Flu can cause fever and chills, sore throat, muscle aches, fatigue, cough, headache, and runny or stuffy nose. Some people may have vomiting and diarrhea, though this is more common in children than adults. In an average year, thousands of people in the [place] die from flu, and many more are hospitalized. Flu vaccine prevents millions of illnesses and flu-related visits to the doctor each year. 2. Influenza vaccines CDC recommends everyone 6 months and older get vaccinated every flu season. Children 6 months through 8 years of age may need 2 doses during a single flu season. Everyone else needs only 1 dose each flu season. It takes about 2 weeks for protection to develop after vaccination. There are many flu viruses, and they are always changing. Each year a new flu vaccine is made to protect against the influenza viruses believed to be likely to cause disease in the upcoming flu season. Even when the vaccine doesn't exactly match these viruses, it may still provide some protection. Influenza vaccine does not cause flu. Influenza vaccine may be given at the same time as other vaccines. 3. Talk with your health care provider Tell your vaccination provider if the person getting the vaccine: Has had  an allergic reaction after a previous dose of influenza vaccine, or has any severe, life-threatening allergies Has ever had Guillain-Barr Syndrome (also called "GBS") In some cases, your health care provider may decide to postpone influenza vaccination until a future visit. Influenza vaccine can be administered at any time during pregnancy. People who are or will be pregnant during influenza season should receive inactivated influenza vaccine. People with minor illnesses, such as a cold, may be vaccinated. People who are moderately or severely ill should usually wait until they recover before getting influenza vaccine. Your health care provider can give you more information. 4. Risks of a vaccine reaction Soreness, redness, and swelling where the shot is given, fever, muscle aches, and headache can happen after influenza vaccination. There may be a very small increased risk of Guillain-Barr Syndrome (GBS) after inactivated influenza vaccine (the flu shot). Young children who get the flu shot along with pneumococcal vaccine (PCV13) and/or DTaP vaccine at the same time might be slightly more likely to have a seizure caused by fever. Tell your health care provider if a child who is getting flu vaccine has ever had a seizure. People sometimes faint after medical procedures, including vaccination. Tell your provider if you feel dizzy or have vision changes or ringing in the ears. As with any medicine, there is a very remote chance of a vaccine causing a severe allergic reaction, other serious injury, or death. 5. What if there is a serious problem? An allergic reaction could occur after the vaccinated person leaves the clinic. If you see signs of a severe allergic reaction (hives, swelling of the face and throat, difficulty breathing,   a fast heartbeat, dizziness, or weakness), call 9-1-1 and get the person to the nearest hospital. For other signs that concern you, call your health care provider. Adverse  reactions should be reported to the Vaccine Adverse Event Reporting System (VAERS). Your health care provider will usually file this report, or you can do it yourself. Visit the VAERS website at www.vaers.hhs.gov or call 1-800-822-7967. VAERS is only for reporting reactions, and VAERS staff members do not give medical advice. 6. The National Vaccine Injury Compensation Program The National Vaccine Injury Compensation Program (VICP) is a federal program that was created to compensate people who may have been injured by certain vaccines. Claims regarding alleged injury or death due to vaccination have a time limit for filing, which may be as short as two years. Visit the VICP website at www.hrsa.gov/vaccinecompensation or call 1-800-338-2382 to learn about the program and about filing a claim. 7. How can I learn more? Ask your health care provider. Call your local or state health department. Visit the website of the Food and Drug Administration (FDA) for vaccine package inserts and additional information at www.fda.gov/vaccines-blood-biologics/vaccines. Contact the Centers for Disease Control and Prevention (CDC): Call 1-800-232-4636 (1-800-CDC-INFO) or Visit CDC's website at www.cdc.gov/flu. Source: CDC Vaccine Information Statement Inactivated Influenza Vaccine (05/23/2020) This same material is available at www.cdc.gov for no charge. This information is not intended to replace advice given to you by your health care provider. Make sure you discuss any questions you have with your health care provider. Document Revised: 09/02/2021 Document Reviewed: 06/25/2021 Elsevier Patient Education  2023 Elsevier Inc.  

## 2022-08-06 ENCOUNTER — Ambulatory Visit (INDEPENDENT_AMBULATORY_CARE_PROVIDER_SITE_OTHER): Payer: BC Managed Care – PPO | Admitting: Nurse Practitioner

## 2022-08-06 ENCOUNTER — Other Ambulatory Visit (HOSPITAL_COMMUNITY)
Admission: RE | Admit: 2022-08-06 | Discharge: 2022-08-06 | Disposition: A | Discharge status: Home / Self Care | Payer: BC Managed Care – PPO | Source: Ambulatory Visit | Attending: Nurse Practitioner | Admitting: Nurse Practitioner

## 2022-08-06 ENCOUNTER — Encounter: Payer: Self-pay | Admitting: Nurse Practitioner

## 2022-08-06 VITALS — BP 113/72 | HR 84 | Temp 98.1°F | Ht 60.7 in | Wt 196.9 lb

## 2022-08-06 DIAGNOSIS — Z789 Other specified health status: Secondary | ICD-10-CM | POA: Insufficient documentation

## 2022-08-06 DIAGNOSIS — E7800 Pure hypercholesterolemia, unspecified: Secondary | ICD-10-CM | POA: Diagnosis not present

## 2022-08-06 DIAGNOSIS — Z124 Encounter for screening for malignant neoplasm of cervix: Secondary | ICD-10-CM | POA: Insufficient documentation

## 2022-08-06 DIAGNOSIS — Z0000 Encounter for general adult medical examination without abnormal findings: Secondary | ICD-10-CM | POA: Diagnosis not present

## 2022-08-06 DIAGNOSIS — E559 Vitamin D deficiency, unspecified: Secondary | ICD-10-CM

## 2022-08-06 DIAGNOSIS — Z8632 Personal history of gestational diabetes: Secondary | ICD-10-CM

## 2022-08-06 DIAGNOSIS — E6609 Other obesity due to excess calories: Secondary | ICD-10-CM

## 2022-08-06 DIAGNOSIS — R7309 Other abnormal glucose: Secondary | ICD-10-CM

## 2022-08-06 DIAGNOSIS — Z23 Encounter for immunization: Secondary | ICD-10-CM

## 2022-08-06 DIAGNOSIS — F419 Anxiety disorder, unspecified: Secondary | ICD-10-CM | POA: Diagnosis not present

## 2022-08-06 DIAGNOSIS — Z6837 Body mass index (BMI) 37.0-37.9, adult: Secondary | ICD-10-CM

## 2022-08-06 LAB — PREGNANCY, URINE: Preg Test, Ur: NEGATIVE

## 2022-08-06 MED ORDER — SERTRALINE HCL 50 MG PO TABS: 50.0000 mg | ORAL_TABLET | Freq: Every day | ORAL | 4 refills | Status: DC

## 2022-08-06 MED ORDER — HAILEY 24 FE 1-20 MG-MCG(24) PO TABS: 1.0000 | ORAL_TABLET | Freq: Every day | ORAL | 4 refills | Status: DC

## 2022-08-06 NOTE — Assessment & Plan Note (Signed)
Ongoing.  Refills sent in and check urine pregnancy and CMP today.

## 2022-08-06 NOTE — Assessment & Plan Note (Signed)
History of in past -- monitor A1c annually.  On labs today.

## 2022-08-06 NOTE — Assessment & Plan Note (Signed)
BMI 37.57.  Recommended eating smaller high protein, low fat meals more frequently and exercising 30 mins a day 5 times a week with a goal of 10-15lb weight loss in the next 3 months. Patient voiced their understanding and motivation to adhere to these recommendations. LABS: CBC, CMP, TSH today.

## 2022-08-06 NOTE — Assessment & Plan Note (Signed)
Noted on labs 06/19/21 -- recheck A1c today and continue focus on diet changes and exercise.

## 2022-08-06 NOTE — Progress Notes (Signed)
BP 113/72   Pulse 84   Temp 98.1 F (36.7 C) (Oral)   Ht 5' 0.7" (1.542 m)   Wt 196 lb 14.4 oz (89.3 kg)   LMP 07/29/2022 (Approximate)   SpO2 97%   BMI 37.57 kg/m    Subjective:    Patient ID: Shari Mccarthy, female    DOB: 10/29/1980, 41 y.o.   MRN: [id number]  HPI: Shari Mccarthy is a 41 y.o. female presenting on 08/06/2022 for comprehensive medical examination. Current medical complaints include:none  She currently lives with: children Menopausal Symptoms: no  Past labs did note elevation in LDL, she is working on diet changes.  A1c 5.9% on check on year ago.  ANXIETY Continues on Zoloft 50 MG daily + taking BCP. Mood status: controlled Satisfied with current treatment?: yes Symptom severity: mild  Duration of current treatment : chronic Side effects: no Medication compliance: good compliance Psychotherapy/counseling: in the past Previous psychiatric medications: Zoloft Depressed mood: no Anxious mood: no Anhedonia: no Significant weight loss or gain: no Insomnia: none Fatigue: no Feelings of worthlessness or guilt: no Impaired concentration/indecisiveness: no Suicidal ideations: no Hopelessness: no Crying spells: no    08/06/2022    8:55 AM 12/16/2021    4:25 PM 10/27/2021    3:29 PM 06/18/2021    9:43 AM 05/01/2020    8:31 AM  Depression screen PHQ 2/9  Decreased Interest 0 1 1 0 1  Down, Depressed, Hopeless 0 1 1 0 1  PHQ - 2 Score 0 2 2 0 2  Altered sleeping 0 1 0  1  Tired, decreased energy 0 1 2  1   Change in appetite 0 0 1  1  Feeling bad or failure about yourself  0 0 0  0  Trouble concentrating 0 0 3  0  Moving slowly or fidgety/restless 0 0 0  0  Suicidal thoughts 0 0 1  0  PHQ-9 Score 0 4 9  5   Difficult doing work/chores Not difficult at all Not difficult at all Somewhat difficult         08/06/2022    8:55 AM 12/16/2021    4:29 PM 10/27/2021    3:32 PM  GAD 7 : Generalized Anxiety Score  Nervous, Anxious, on Edge 0 1 2  Control/stop  worrying 0 1 2  Worry too much - different things 0 0 2  Trouble relaxing 0 0 3  Restless 0 0 0  Easily annoyed or irritable 0 0 1  Afraid - awful might happen 0 0 1  Total GAD 7 Score 0 2 11  Anxiety Difficulty Not difficult at all Not difficult at all         05/01/2020    8:29 AM 12/23/2020    4:27 PM 06/18/2021    9:43 AM 08/06/2022    8:55 AM 08/06/2022    9:04 AM  Fall Risk  Falls in the past year? 0  0 0 0  Was there an injury with Fall? 0  0 0 0  Fall Risk Category Calculator 0  0 0 0  Fall Risk Category Low  Low Low Low  Patient Fall Risk Level Low fall risk Low fall risk Low fall risk Low fall risk Low fall risk  Patient at Risk for Falls Due to No Fall Risks  No Fall Risks No Fall Risks No Fall Risks  Fall risk Follow up Falls prevention discussed  Falls evaluation completed Falls evaluation completed Falls prevention discussed  Functional Status Survey: Is the patient deaf or have difficulty hearing?: No Does the patient have difficulty seeing, even when wearing glasses/contacts?: No Does the patient have difficulty concentrating, remembering, or making decisions?: No Does the patient have difficulty walking or climbing stairs?: No Does the patient have difficulty dressing or bathing?: No Does the patient have difficulty doing errands alone such as visiting a doctor's office or shopping?: No   Past Medical History:  Past Medical History:  Diagnosis Date   Gestational diabetes     Surgical History:  History reviewed. No pertinent surgical history.  Medications:  Current Outpatient Medications on File Prior to Visit  Medication Sig   calcium carbonate (TUMS) 500 MG chewable tablet Chew 1 tablet (200 mg of elemental calcium total) by mouth daily.   ibuprofen (ADVIL,MOTRIN) 600 MG tablet Take 1 tablet (600 mg total) by mouth every 6 (six) hours.   Multiple Vitamins-Minerals (WOMENS MULTIVITAMIN PO) Take by mouth.   No current facility-administered medications on  file prior to visit.    Allergies:  Allergies  Allergen Reactions   Prednisone Other (See Comments)    Irritability     Social History:  Social History   Socioeconomic History   Marital status: Married    Spouse name: Not on file   Number of children: Not on file   Years of education: Not on file   Highest education level: Not on file  Occupational History   Not on file  Tobacco Use   Smoking status: Never   Smokeless tobacco: Never  Vaping Use   Vaping Use: Never used  Substance and Sexual Activity   Alcohol use: No   Drug use: No   Sexual activity: Not Currently  Other Topics Concern   Not on file  Social History Narrative   Not on file   Social Determinants of Health   Financial Resource Strain: Low Risk  (06/18/2021)   Overall Financial Resource Strain (CARDIA)    Difficulty of Paying Living Expenses: Not hard at all  Food Insecurity: No Food Insecurity (06/18/2021)   Hunger Vital Sign    Worried About Running Out of Food in the Last Year: Never true    Ran Out of Food in the Last Year: Never true  Transportation Needs: No Transportation Needs (06/18/2021)   PRAPARE - Administrator, Civil ServiceTransportation    Lack of Transportation (Medical): No    Lack of Transportation (Non-Medical): No  Physical Activity: Insufficiently Active (06/18/2021)   Exercise Vital Sign    Days of Exercise per Week: 4 days    Minutes of Exercise per Session: 30 min  Stress: No Stress Concern Present (06/18/2021)   [employer] - Occupational Stress Questionnaire    Feeling of Stress : Only a little  Social Connections: Socially Isolated (06/18/2021)   Social Connection and Isolation Panel [NHANES]    Frequency of Communication with Friends and Family: Three times a week    Frequency of Social Gatherings with Friends and Family: Three times a week    Attends Religious Services: Never    Active Member of Clubs or Organizations: No    Attends BankerClub or Organization Meetings: Never     Marital Status: Separated  Intimate Partner Violence: Not At Risk (06/18/2021)   Humiliation, Afraid, Rape, and Kick questionnaire    Fear of Current or Ex-Partner: No    Emotionally Abused: No    Physically Abused: No    Sexually Abused: No   Social History   Tobacco  Use  Smoking Status Never  Smokeless Tobacco Never   Social History   Substance and Sexual Activity  Alcohol Use No    Family History:  Family History  Problem Relation Age of Onset   Heart disease Father    Cancer Maternal Grandfather    Diabetes Paternal Grandfather     Past medical history, surgical history, medications, allergies, family history and social history reviewed with patient today and changes made to appropriate areas of the chart.   Review of Systems - negative All other ROS negative except what is listed above and in the HPI.      Objective:    BP 113/72   Pulse 84   Temp 98.1 F (36.7 C) (Oral)   Ht 5' 0.7" (1.542 m)   Wt 196 lb 14.4 oz (89.3 kg)   LMP 07/29/2022 (Approximate)   SpO2 97%   BMI 37.57 kg/m   Wt Readings from Last 3 Encounters:  08/06/22 196 lb 14.4 oz (89.3 kg)  06/18/21 193 lb (87.5 kg)  12/23/20 195 lb (88.5 kg)    Physical Exam Vitals and nursing note reviewed. Exam conducted with a chaperone present.  Constitutional:      General: She is awake. She is not in acute distress.    Appearance: She is well-developed and well-groomed. She is obese. She is not ill-appearing or toxic-appearing.  HENT:     Head: Normocephalic and atraumatic.     Right Ear: Hearing, tympanic membrane, ear canal and external ear normal. No drainage.     Left Ear: Hearing, tympanic membrane, ear canal and external ear normal. No drainage.     Nose: Nose normal.     Right Sinus: No maxillary sinus tenderness or frontal sinus tenderness.     Left Sinus: No maxillary sinus tenderness or frontal sinus tenderness.     Mouth/Throat:     Mouth: Mucous membranes are moist.     Pharynx:  Oropharynx is clear. Uvula midline. No pharyngeal swelling, oropharyngeal exudate or posterior oropharyngeal erythema.  Eyes:     General: Lids are normal.        Right eye: No discharge.        Left eye: No discharge.     Extraocular Movements: Extraocular movements intact.     Conjunctiva/sclera: Conjunctivae normal.     Pupils: Pupils are equal, round, and reactive to light.     Visual Fields: Right eye visual fields normal and left eye visual fields normal.  Neck:     Thyroid: No thyromegaly.     Vascular: No carotid bruit.     Trachea: Trachea normal.  Cardiovascular:     Rate and Rhythm: Normal rate and regular rhythm.     Heart sounds: Normal heart sounds. No murmur heard.    No gallop.  Pulmonary:     Effort: Pulmonary effort is normal. No accessory muscle usage or respiratory distress.     Breath sounds: Normal breath sounds.  Chest:  Breasts:    Right: Normal.     Left: Normal.  Abdominal:     General: Bowel sounds are normal.     Palpations: Abdomen is soft. There is no hepatomegaly or splenomegaly.     Tenderness: There is no abdominal tenderness.     Hernia: There is no hernia in the left inguinal area or right inguinal area.  Genitourinary:    Exam position: Lithotomy position.     Labia:        Right: No rash.  Left: No rash.      Urethra: No prolapse.     Vagina: Normal.     Cervix: Normal.     Uterus: Normal.      Adnexa: Right adnexa normal and left adnexa normal.     Comments: Cervix posterior on exam, viewed.  Pap obtained. Musculoskeletal:        General: Normal range of motion.     Cervical back: Normal range of motion and neck supple.     Right lower leg: No edema.     Left lower leg: No edema.  Lymphadenopathy:     Head:     Right side of head: No submental, submandibular, tonsillar, preauricular or posterior auricular adenopathy.     Left side of head: No submental, submandibular, tonsillar, preauricular or posterior auricular adenopathy.      Cervical: No cervical adenopathy.     Upper Body:     Right upper body: No supraclavicular, axillary or pectoral adenopathy.     Left upper body: No supraclavicular, axillary or pectoral adenopathy.  Skin:    General: Skin is warm and dry.     Capillary Refill: Capillary refill takes less than 2 seconds.     Findings: No rash.  Neurological:     Mental Status: She is alert and oriented to person, place, and time.     Gait: Gait is intact.     Deep Tendon Reflexes: Reflexes are normal and symmetric.     Reflex Scores:      Brachioradialis reflexes are 2+ on the right side and 2+ on the left side.      Patellar reflexes are 2+ on the right side and 2+ on the left side. Psychiatric:        Attention and Perception: Attention normal.        Mood and Affect: Mood normal.        Speech: Speech normal.        Behavior: Behavior normal. Behavior is cooperative.        Thought Content: Thought content normal.        Judgment: Judgment normal.    Results for orders placed or performed in visit on 08/06/22  Pregnancy, urine  Result Value Ref Range   Preg Test, Ur Negative Negative      Assessment & Plan:   Problem List Items Addressed This Visit       Other   Anxiety    Chronic, stable.  Denies SI/HI.  Continue Zoloft 50 MG at this time and Vistaril as needed, adjust dosing as needed.  Refills sent in.  Labs today.      Relevant Medications   sertraline (ZOLOFT) 50 MG tablet   Other Relevant Orders   TSH   Elevated hemoglobin A1c measurement    Noted on labs 06/19/21 -- recheck A1c today and continue focus on diet changes and exercise.      Relevant Orders   Comprehensive metabolic panel   HgB A1c   Elevated low density lipoprotein (LDL) cholesterol level - Primary    Noted on past labs, recheck lipid panel today.  She is fasting.  Recommend heavy focus on diet and regular activity.      Relevant Orders   Comprehensive metabolic panel   Lipid Panel w/o Chol/HDL Ratio    History of gestational diabetes    History of in past -- monitor A1c annually.  On labs today.      Relevant Orders   HgB A1c  Obesity    BMI 37.57.  Recommended eating smaller high protein, low fat meals more frequently and exercising 30 mins a day 5 times a week with a goal of 10-15lb weight loss in the next 3 months. Patient voiced their understanding and motivation to adhere to these recommendations. LABS: CBC, CMP, TSH today.       Uses birth control    Ongoing.  Refills sent in and check urine pregnancy and CMP today.      Relevant Orders   Pregnancy, urine (Completed)   Other Visit Diagnoses     Vitamin D deficiency       History of low levels, check on labs today and start supplement as needed.   Relevant Orders   VITAMIN D 25 Hydroxy (Vit-D Deficiency, Fractures)   Cervical cancer screening       Pap obtained on exam today and sent to lab.   Relevant Orders   Cytology - PAP   Flu vaccine need       Flu vaccine in office today.   Relevant Orders   Flu Vaccine QUAD 6+ mos PF IM (Fluarix Quad PF) (Completed)   Encounter for annual physical exam       Annual physical today with labs and health maintenance reviewed.   Relevant Orders   CBC with Differential/Platelet        Follow up plan: Return in about 6 months (around 02/05/2023) for MOOD (may be virtual).   LABORATORY TESTING:  - Pap smear: Performed today  IMMUNIZATIONS:   - Tdap: Tetanus vaccination status reviewed: last tetanus booster within 10 years. - Influenza: Up to date - Pneumovax: Not applicable - Prevnar: Not applicable - HPV: Not applicable - Zostavax vaccine: Not applicable  SCREENING: -Mammogram: Not applicable  - Colonoscopy: Not applicable  - Bone Density: Not applicable  -Hearing Test: Not applicable  -Spirometry: Not applicable   PATIENT COUNSELING:   Advised to take 1 mg of folate supplement per day if capable of pregnancy.   Sexuality: Discussed sexually transmitted  diseases, partner selection, use of condoms, avoidance of unintended pregnancy  and contraceptive alternatives.   Advised to avoid cigarette smoking.  I discussed with the patient that most people either abstain from alcohol or drink within safe limits (<=14/week and <=4 drinks/occasion for males, <=7/weeks and <= 3 drinks/occasion for females) and that the risk for alcohol disorders and other health effects rises proportionally with the number of drinks per week and how often a drinker exceeds daily limits.  Discussed cessation/primary prevention of drug use and availability of treatment for abuse.   Diet: Encouraged to adjust caloric intake to maintain  or achieve ideal body weight, to reduce intake of dietary saturated fat and total fat, to limit sodium intake by avoiding high sodium foods and not adding table salt, and to maintain adequate dietary potassium and calcium preferably from fresh fruits, vegetables, and low-fat dairy products.    Stressed the importance of regular exercise  Injury prevention: Discussed safety belts, safety helmets, smoke detector, smoking near bedding or upholstery.   Dental health: Discussed importance of regular tooth brushing, flossing, and dental visits.    NEXT PREVENTATIVE PHYSICAL DUE IN 1 YEAR. Return in about 6 months (around 02/05/2023) for MOOD (may be virtual).

## 2022-08-06 NOTE — Assessment & Plan Note (Signed)
Noted on past labs, recheck lipid panel today.  She is fasting.  Recommend heavy focus on diet and regular activity.

## 2022-08-06 NOTE — Assessment & Plan Note (Signed)
Chronic, stable.  Denies SI/HI.  Continue Zoloft 50 MG at this time and Vistaril as needed, adjust dosing as needed.  Refills sent in.  Labs today.

## 2022-08-07 LAB — CBC WITH DIFFERENTIAL/PLATELET
Basophils Absolute: 0 10*3/uL (ref 0.0–0.2)
Basos: 0 %
EOS (ABSOLUTE): 0.1 10*3/uL (ref 0.0–0.4)
Eos: 1 %
Hematocrit: 39 % (ref 34.0–46.6)
Hemoglobin: 12.8 g/dL (ref 11.1–15.9)
Immature Grans (Abs): 0 10*3/uL (ref 0.0–0.1)
Immature Granulocytes: 0 %
Lymphocytes Absolute: 2.7 10*3/uL (ref 0.7–3.1)
Lymphs: 39 %
MCH: 30.6 pg (ref 26.6–33.0)
MCHC: 32.8 g/dL (ref 31.5–35.7)
MCV: 93 fL (ref 79–97)
Monocytes Absolute: 0.4 10*3/uL (ref 0.1–0.9)
Monocytes: 5 %
Neutrophils Absolute: 3.8 10*3/uL (ref 1.4–7.0)
Neutrophils: 55 %
Platelets: 380 10*3/uL (ref 150–450)
RBC: 4.18 x10E6/uL (ref 3.77–5.28)
RDW: 12.5 % (ref 11.7–15.4)
WBC: 7 10*3/uL (ref 3.4–10.8)

## 2022-08-07 LAB — HEMOGLOBIN A1C
Est. average glucose Bld gHb Est-mCnc: 120 mg/dL
Hgb A1c MFr Bld: 5.8 % — ABNORMAL HIGH (ref 4.8–5.6)

## 2022-08-07 LAB — COMPREHENSIVE METABOLIC PANEL
ALT: 19 IU/L (ref 0–32)
AST: 19 IU/L (ref 0–40)
Albumin/Globulin Ratio: 1.6 (ref 1.2–2.2)
Albumin: 4.4 g/dL (ref 3.9–4.9)
Alkaline Phosphatase: 80 IU/L (ref 44–121)
BUN/Creatinine Ratio: 15 (ref 9–23)
BUN: 8 mg/dL (ref 6–20)
Bilirubin Total: 0.5 mg/dL (ref 0.0–1.2)
CO2: 21 mmol/L (ref 20–29)
Calcium: 8.6 mg/dL — ABNORMAL LOW (ref 8.7–10.2)
Chloride: 101 mmol/L (ref 96–106)
Creatinine, Ser: 0.55 mg/dL — ABNORMAL LOW (ref 0.57–1.00)
Globulin, Total: 2.8 g/dL (ref 1.5–4.5)
Glucose: 81 mg/dL (ref 70–99)
Potassium: 4.3 mmol/L (ref 3.5–5.2)
Sodium: 137 mmol/L (ref 134–144)
Total Protein: 7.2 g/dL (ref 6.0–8.5)
eGFR: 119 mL/min/{1.73_m2} (ref >59)

## 2022-08-07 LAB — LIPID PANEL W/O CHOL/HDL RATIO
Cholesterol, Total: 220 mg/dL — ABNORMAL HIGH (ref 100–199)
HDL: 58 mg/dL (ref >39)
LDL Chol Calc (NIH): 149 mg/dL — ABNORMAL HIGH (ref 0–99)
Triglycerides: 76 mg/dL (ref 0–149)
VLDL Cholesterol Cal: 13 mg/dL (ref 5–40)

## 2022-08-07 LAB — VITAMIN D 25 HYDROXY (VIT D DEFICIENCY, FRACTURES): Vit D, 25-Hydroxy: 35.4 ng/mL (ref 30.0–100.0)

## 2022-08-07 LAB — TSH: TSH: 2.09 u[IU]/mL (ref 0.450–4.500)

## 2022-08-08 NOTE — Progress Notes (Signed)
Contacted via MyChart   Good morning Gabriell, your labs have returned: - Kidney function, creatinine and eGFR, remains normal, as is liver function, AST and ALT. Calcium level mildly low, I would recommend taking a multivitamin daily that has calcium in it. - A1c remains in prediabetic range at 5.8% -- continue to focus on healthy diet and exercise 30 minutes 5 days a week -- even walking is fantastic.  Goal is to avoid progression to diabetes with aging, which you are higher risk for with your history of diabetes during pregnancy. - CBC shows no anemia or infection.  Thyroid (TSH) and Vitamin D within normal range. - Your cholesterol is still high, but continued recommendations to make lifestyle changes. Your LDL is above normal. The LDL is the bad cholesterol. Over time and in combination with inflammation and other factors, this contributes to plaque which in turn may lead to stroke and/or heart attack down the road. Sometimes high LDL is primarily genetic, and people might be eating all the right foods but still have high numbers. Other times, there is room for improvement in one's diet and eating healthier can bring this number down and potentially reduce one's risk of heart attack and/or stroke.   To reduce your LDL, Remember - more fruits and vegetables, more fish, and limit red meat and dairy products. More soy, nuts, beans, barley, lentils, oats and plant sterol ester enriched margarine instead of butter. I also encourage eliminating sugar and processed food. Remember, shop on the outside of the grocery store and visit your [employer]. If you would like to talk with me about dietary changes for your cholesterol, please let me know. We should recheck your cholesterol in 12 months.  Any questions? Keep being amazing!!  Thank you for allowing me to participate in your care.  I appreciate you. Kindest regards, Koa Zoeller

## 2022-08-11 LAB — CYTOLOGY - PAP
Comment: NEGATIVE
Diagnosis: NEGATIVE
High risk HPV: NEGATIVE

## 2022-08-11 NOTE — Progress Notes (Signed)
Contacted via MyChart   Pap smear returned negative, do not need to repeat for 5 years:)

## 2023-02-06 NOTE — Patient Instructions (Signed)
Managing Anxiety, Adult After being diagnosed with anxiety, you may be relieved to know why you have felt or behaved a certain way. You may also feel overwhelmed about the treatment ahead and what it will mean for your life. With care and support, you can manage your anxiety. How to manage lifestyle changes Understanding the difference between stress and anxiety Although stress can play a role in anxiety, it is not the same as anxiety. Stress is your body's reaction to life changes and events, both good and bad. Stress is often caused by something external, such as a deadline, test, or competition. It normally goes away after the event has ended and will last just a few hours. But, stress can be ongoing and can lead to more than just stress. Anxiety is caused by something internal, such as imagining a terrible outcome or worrying that something will go wrong that will greatly upset you. Anxiety often does not go away even after the event is over, and it can become a long-term (chronic) worry. Lowering stress and anxiety Talk with your health care provider or a counselor to learn more about lowering anxiety and stress. They may suggest tension-reduction techniques, such as: Music. Spend time creating or listening to music that you enjoy and that inspires you. Mindfulness-based meditation. Practice being aware of your normal breaths while not trying to control your breathing. It can be done while sitting or walking. Centering prayer. Focus on a word, phrase, or sacred image that means something to you and brings you peace. Deep breathing. Expand your stomach and inhale slowly through your nose. Hold your breath for 3-5 seconds. Then breathe out slowly, letting your stomach muscles relax. Self-talk. Learn to notice and spot thought patterns that lead to anxiety reactions. Change those patterns to thoughts that feel peaceful. Muscle relaxation. Take time to tense muscles and then relax them. Choose a  tension-reduction technique that fits your lifestyle and personality. These techniques take time and practice. Set aside 5-15 minutes a day to do them. Specialized therapists can offer counseling and training in these techniques. The training to help with anxiety may be covered by some insurance plans. Other things you can do to manage stress and anxiety include: Keeping a stress diary. This can help you learn what triggers your reaction and then learn ways to manage your response. Thinking about how you react to certain situations. You may not be able to control everything, but you can control your response. Making time for activities that help you relax and not feeling guilty about spending your time in this way. Doing visual imagery. This involves imagining or creating mental pictures to help you relax. Practicing yoga. Through yoga poses, you can lower tension and relax.  Medicines Medicines for anxiety include: Antidepressant medicines. These are usually prescribed for long-term daily control. Anti-anxiety medicines. These may be added in severe cases, especially when panic attacks occur. When used together, medicines, psychotherapy, and tension-reduction techniques may be the most effective treatment. Relationships Relationships can play a big part in helping you recover. Spend more time connecting with trusted friends and family members. Think about going to couples counseling if you have a partner, taking family education classes, or going to family therapy. Therapy can help you and others better understand your anxiety. How to recognize changes in your anxiety Everyone responds differently to treatment for anxiety. Recovery from anxiety happens when symptoms lessen and stop interfering with your daily life at home or work. This may mean that you   will start to: Have better concentration and focus. Worry will interfere less in your daily thinking. Sleep better. Be less irritable. Have more  energy. Have improved memory. Try to recognize when your condition is getting worse. Contact your provider if your symptoms interfere with home or work and you feel like your condition is not improving. Follow these instructions at home: Activity Exercise. Adults should: Exercise for at least 150 minutes each week. The exercise should increase your heart rate and make you sweat (moderate-intensity exercise). Do strengthening exercises at least twice a week. Get the right amount and quality of sleep. Most adults need 7-9 hours of sleep each night. Lifestyle  Eat a healthy diet that includes plenty of vegetables, fruits, whole grains, low-fat dairy products, and lean protein. Do not eat a lot of foods that are high in fats, added sugars, or salt (sodium). Make choices that simplify your life. Do not use any products that contain nicotine or tobacco. These products include cigarettes, chewing tobacco, and vaping devices, such as e-cigarettes. If you need help quitting, ask your provider. Avoid caffeine, alcohol, and certain over-the-counter cold medicines. These may make you feel worse. Ask your pharmacist which medicines to avoid. General instructions Take over-the-counter and prescription medicines only as told by your provider. Keep all follow-up visits. This is to make sure you are managing your anxiety well or if you need more support. Where to find support You can get help and support from: Self-help groups. Online and community organizations. A trusted spiritual leader. Couples counseling. Family education classes. Family therapy. Where to find more information You may find that joining a support group helps you deal with your anxiety. The following sources can help you find counselors or support groups near you: Mental Health America: mentalhealthamerica.net Anxiety and Depression Association of America (ADAA): adaa.org National Alliance on Mental Illness (NAMI): nami.org Contact  a health care provider if: You have a hard time staying focused or finishing tasks. You spend many hours a day feeling worried about everyday life. You are very tired because you cannot stop worrying. You start to have headaches or often feel tense. You have chronic nausea or diarrhea. Get help right away if: Your heart feels like it is racing. You have shortness of breath. You have thoughts of hurting yourself or others. Get help right away if you feel like you may hurt yourself or others, or have thoughts about taking your own life. Go to your nearest emergency room or: Call 911. Call the National Suicide Prevention Lifeline at 1-800-273-8255 or 988. This is open 24 hours a day. Text the Crisis Text Line at 741741. This information is not intended to replace advice given to you by your health care provider. Make sure you discuss any questions you have with your health care provider. Document Revised: 07/13/2022 Document Reviewed: 01/25/2021 Elsevier Patient Education  2023 Elsevier Inc.  

## 2023-02-07 ENCOUNTER — Telehealth (INDEPENDENT_AMBULATORY_CARE_PROVIDER_SITE_OTHER): Payer: BC Managed Care – PPO | Admitting: Nurse Practitioner

## 2023-02-07 ENCOUNTER — Encounter: Payer: Self-pay | Admitting: Nurse Practitioner

## 2023-02-07 DIAGNOSIS — F419 Anxiety disorder, unspecified: Secondary | ICD-10-CM

## 2023-02-07 MED ORDER — SERTRALINE HCL 50 MG PO TABS: 75.0000 mg | ORAL_TABLET | Freq: Every day | ORAL | 4 refills | Status: DC

## 2023-02-07 NOTE — Progress Notes (Signed)
There were no vitals taken for this visit.   Subjective:    Patient ID: Shari Mccarthy, female    DOB: 1981-07-08, 42 y.o.   MRN: [id number]  HPI: Shari Mccarthy is a 42 y.o. female  Chief Complaint  Patient presents with   Mood   This visit was completed via video visit through MyChart due to the restrictions of the COVID-19 pandemic. All issues as above were discussed and addressed. Physical exam was done as above through visual confirmation on video through MyChart. If it was felt that the patient should be evaluated in the office, they were directed there. The patient verbally consented to this visit. Location of the patient: home Location of the provider: work Those involved with this call:  Provider: Aura Dials, DNP CMA: Tristan Schroeder, CMA Front Desk/Registration: Ozella Almond  Time spent on call:  21 minutes with patient face to face via video conference. More than 50% of this time was spent in counseling and coordination of care. 15 minutes total spent in review of patient's record and preparation of their chart.  I verified patient identity using two factors (patient name and date of birth). Patient consents verbally to being seen via telemedicine visit today.    ANXIETY/STRESS Continues on Zoloft 50 MG daily.   Duration:stable Anxious mood: occasional  Excessive worrying: occasional Irritability: no  Sweating: no Nausea: no Palpitations:no Hyperventilation: no Panic attacks: no Agoraphobia: no  Obscessions/compulsions: no Depressed mood:occasional    Feb 18, 2023    3:10 PM 08/06/2022    8:55 AM 12/16/2021    4:25 PM 10/27/2021    3:29 PM 06/18/2021    9:43 AM  Depression screen PHQ 2/9  Decreased Interest 0 0 1 1 0  Down, Depressed, Hopeless 1 0 1 1 0  PHQ - 2 Score 1 0 2 2 0  Altered sleeping 0 0 1 0   Tired, decreased energy 1 0 1 2   Change in appetite 0 0 0 1   Feeling bad or failure about yourself  1 0 0 0   Trouble concentrating 0 0 0 3    Moving slowly or fidgety/restless 0 0 0 0   Suicidal thoughts 0 0 0 1   PHQ-9 Score 3 0 4 9   Difficult doing work/chores Somewhat difficult Not difficult at all Not difficult at all Somewhat difficult   Anhedonia: no Weight changes: no Insomnia: none Hypersomnia: no Fatigue/loss of energy: no Feelings of worthlessness: sometimes Feelings of guilt: sometimes Impaired concentration/indecisiveness: no Suicidal ideations: no  Crying spells: yes today, improved from previous Recent Stressors/Life Changes: yes   Relationship problems: no   Family stress: yes     Financial stress: no    Job stress: no    Recent death/loss: no     2023-02-18    3:12 PM 08/06/2022    8:55 AM 12/16/2021    4:29 PM 10/27/2021    3:32 PM  GAD 7 : Generalized Anxiety Score  Nervous, Anxious, on Edge 1 0 1 2  Control/stop worrying 0 0 1 2  Worry too much - different things 0 0 0 2  Trouble relaxing 0 0 0 3  Restless 0 0 0 0  Easily annoyed or irritable 0 0 0 1  Afraid - awful might happen 1 0 0 1  Total GAD 7 Score 2 0 2 11  Anxiety Difficulty Somewhat difficult Not difficult at all Not difficult at all    Relevant past medical, surgical,  family and social history reviewed and updated as indicated. Interim medical history since our last visit reviewed. Allergies and medications reviewed and updated.  Review of Systems  Constitutional:  Negative for activity change, appetite change, diaphoresis, fatigue and fever.  Respiratory:  Negative for cough, chest tightness and shortness of breath.   Cardiovascular:  Negative for chest pain, palpitations and leg swelling.  Neurological: Negative.   Psychiatric/Behavioral:  Negative for decreased concentration, sleep disturbance and suicidal ideas. The patient is nervous/anxious.    Per HPI unless specifically indicated above     Objective:    There were no vitals taken for this visit.  Wt Readings from Last 3 Encounters:  08/06/22 196 lb 14.4 oz (89.3 kg)   06/18/21 193 lb (87.5 kg)  12/23/20 195 lb (88.5 kg)    Physical Exam Vitals and nursing note reviewed.  Constitutional:      General: She is awake. She is not in acute distress.    Appearance: She is well-developed. She is not ill-appearing.  HENT:     Head: Normocephalic.     Right Ear: Hearing normal.     Left Ear: Hearing normal.  Eyes:     General: Lids are normal.        Right eye: No discharge.        Left eye: No discharge.     Conjunctiva/sclera: Conjunctivae normal.  Pulmonary:     Effort: Pulmonary effort is normal. No accessory muscle usage or respiratory distress.  Musculoskeletal:     Cervical back: Normal range of motion.  Neurological:     Mental Status: She is alert and oriented to person, place, and time.  Psychiatric:        Attention and Perception: Attention normal.        Mood and Affect: Mood normal.        Behavior: Behavior normal. Behavior is cooperative.        Thought Content: Thought content normal.        Judgment: Judgment normal.    Results for orders placed or performed in visit on 08/06/22  CBC with Differential/Platelet  Result Value Ref Range   WBC 7.0 3.4 - 10.8 x10E3/uL   RBC 4.18 3.77 - 5.28 x10E6/uL   Hemoglobin 12.8 11.1 - 15.9 g/dL   Hematocrit 16.1 09.6 - 46.6 %   MCV 93 79 - 97 fL   MCH 30.6 26.6 - 33.0 pg   MCHC 32.8 31.5 - 35.7 g/dL   RDW 04.5 40.9 - 81.1 %   Platelets 380 150 - 450 x10E3/uL   Neutrophils 55 Not Estab. %   Lymphs 39 Not Estab. %   Monocytes 5 Not Estab. %   Eos 1 Not Estab. %   Basos 0 Not Estab. %   Neutrophils Absolute 3.8 1.4 - 7.0 x10E3/uL   Lymphocytes Absolute 2.7 0.7 - 3.1 x10E3/uL   Monocytes Absolute 0.4 0.1 - 0.9 x10E3/uL   EOS (ABSOLUTE) 0.1 0.0 - 0.4 x10E3/uL   Basophils Absolute 0.0 0.0 - 0.2 x10E3/uL   Immature Granulocytes 0 Not Estab. %   Immature Grans (Abs) 0.0 0.0 - 0.1 x10E3/uL  Comprehensive metabolic panel  Result Value Ref Range   Glucose 81 70 - 99 mg/dL   BUN 8 6 - 20  mg/dL   Creatinine, Ser 9.14 (L) 0.57 - 1.00 mg/dL   eGFR 782 >95 AO/ZHY/8.65   BUN/Creatinine Ratio 15 9 - 23   Sodium 137 134 - 144 mmol/L   Potassium  4.3 3.5 - 5.2 mmol/L   Chloride 101 96 - 106 mmol/L   CO2 21 20 - 29 mmol/L   Calcium 8.6 (L) 8.7 - 10.2 mg/dL   Total Protein 7.2 6.0 - 8.5 g/dL   Albumin 4.4 3.9 - 4.9 g/dL   Globulin, Total 2.8 1.5 - 4.5 g/dL   Albumin/Globulin Ratio 1.6 1.2 - 2.2   Bilirubin Total 0.5 0.0 - 1.2 mg/dL   Alkaline Phosphatase 80 44 - 121 IU/L   AST 19 0 - 40 IU/L   ALT 19 0 - 32 IU/L  Lipid Panel w/o Chol/HDL Ratio  Result Value Ref Range   Cholesterol, Total 220 (H) 100 - 199 mg/dL   Triglycerides 76 0 - 149 mg/dL   HDL 58 >16 mg/dL   VLDL Cholesterol Cal 13 5 - 40 mg/dL   LDL Chol Calc (NIH) 109 (H) 0 - 99 mg/dL  TSH  Result Value Ref Range   TSH 2.090 0.450 - 4.500 uIU/mL  VITAMIN D 25 Hydroxy (Vit-D Deficiency, Fractures)  Result Value Ref Range   Vit D, 25-Hydroxy 35.4 30.0 - 100.0 ng/mL  HgB A1c  Result Value Ref Range   Hgb A1c MFr Bld 5.8 (H) 4.8 - 5.6 %   Est. average glucose Bld gHb Est-mCnc 120 mg/dL  Pregnancy, urine  Result Value Ref Range   Preg Test, Ur Negative Negative  Cytology - PAP  Result Value Ref Range   High risk HPV Negative    Adequacy      Satisfactory for evaluation; transformation zone component PRESENT.   Diagnosis      - Negative for intraepithelial lesion or malignancy (NILM)   Comment Normal Reference Range HPV - Negative       Assessment & Plan:   Problem List Items Addressed This Visit       Other   Anxiety - Primary    Chronic, ongoing with occasional anxiety due to stressors.  Denies SI/HI.  Will trial an increase in Zoloft to 75 MG daily at this time, adjust dosing as needed.  Refills sent in.  If too much anhedonia or numbness with this will reduce back down to 50 MG dosing.  Return in 6 weeks.      Relevant Medications   sertraline (ZOLOFT) 50 MG tablet    I discussed the assessment  and treatment plan with the patient. The patient was provided an opportunity to ask questions and all were answered. The patient agreed with the plan and demonstrated an understanding of the instructions.   The patient was advised to call back or seek an in-person evaluation if the symptoms worsen or if the condition fails to improve as anticipated.   I provided 21+ minutes of time during this encounter.    Follow up plan: Return in about 6 weeks (around 03/21/2023) for MOOD -- increased Sertraline to 75 MG daily.

## 2023-02-07 NOTE — Assessment & Plan Note (Signed)
Chronic, ongoing with occasional anxiety due to stressors.  Denies SI/HI.  Will trial an increase in Zoloft to 75 MG daily at this time, adjust dosing as needed.  Refills sent in.  If too much anhedonia or numbness with this will reduce back down to 50 MG dosing.  Return in 6 weeks.

## 2023-02-08 NOTE — Progress Notes (Signed)
Called and scheduled appointment for 6 week follow up on 03/22/2023 @ 2:40 pm.

## 2023-02-14 ENCOUNTER — Encounter: Payer: Self-pay | Admitting: Nurse Practitioner

## 2023-03-18 ENCOUNTER — Ambulatory Visit (INDEPENDENT_AMBULATORY_CARE_PROVIDER_SITE_OTHER): Payer: BC Managed Care – PPO | Admitting: Physician Assistant

## 2023-03-18 ENCOUNTER — Encounter: Payer: Self-pay | Admitting: Physician Assistant

## 2023-03-18 ENCOUNTER — Encounter: Payer: Self-pay | Admitting: Nurse Practitioner

## 2023-03-18 VITALS — BP 136/86 | HR 86 | Temp 98.4°F | Ht 60.71 in | Wt 207.8 lb

## 2023-03-18 DIAGNOSIS — J069 Acute upper respiratory infection, unspecified: Secondary | ICD-10-CM | POA: Diagnosis not present

## 2023-03-18 MED ORDER — AZITHROMYCIN 250 MG PO TABS: ORAL_TABLET | ORAL | 0 refills | Status: DC

## 2023-03-18 NOTE — Progress Notes (Unsigned)
Acute Office Visit   Patient: Shari Mccarthy   DOB: 20-Oct-1980   42 y.o. Female  MRN: [id number] Visit Date: 03/18/2023  Today's healthcare provider: Oswaldo Conroy Rafael Quesada, PA-C  Introduced myself to the patient as a Secondary school teacher and provided education on APPs in clinical practice.    Chief Complaint  Patient presents with   Sick    Cough and congestion, started last week    Subjective    HPI HPI     Sick    Additional comments: Cough and congestion, started last week       Last edited by Sherolyn Buba, CMA on 03/18/2023 10:57 AM.        Shari Mccarthy last Monday States she started feeling sick with cough and congestion, sinus headache and sinus pressure She also reports some chills and feeling feverish She states when she first wakes up in the AM she usually has yellowish drainage  She reports intermittently productive cough with clear sputum  She reports some SOB with climbing stairs  Interventions:She has been using cold and flu medication and Robitussin for symptoms  Recent sick contacts: coworker was sick last week Recent travel: none She has not tested herself for COVID at home    Medications: Outpatient Medications Prior to Visit  Medication Sig   calcium carbonate (TUMS) 500 MG chewable tablet Chew 1 tablet (200 mg of elemental calcium total) by mouth daily.   ibuprofen (ADVIL,MOTRIN) 600 MG tablet Take 1 tablet (600 mg total) by mouth every 6 (six) hours.   Multiple Vitamins-Minerals (WOMENS MULTIVITAMIN PO) Take by mouth.   Norethindrone Acetate-Ethinyl Estrad-FE (HAILEY 24 FE) 1-20 MG-MCG(24) tablet Take 1 tablet by mouth daily.   sertraline (ZOLOFT) 50 MG tablet Take 1.5 tablets (75 mg total) by mouth daily.   No facility-administered medications prior to visit.    Review of Systems  Constitutional:  Positive for chills and fatigue. Negative for fever.  HENT:  Positive for congestion, rhinorrhea, sinus pressure, sinus pain and sore throat. Negative for  ear pain and postnasal drip.   Eyes:  Negative for discharge and itching.  Respiratory:  Positive for cough. Negative for shortness of breath and wheezing.   Cardiovascular:  Negative for chest pain, palpitations and leg swelling.  Gastrointestinal:  Positive for diarrhea. Negative for nausea and vomiting.  Musculoskeletal:  Positive for myalgias.  Neurological:  Positive for light-headedness and headaches.    {Labs  Heme  Chem  Endocrine  Serology  Results Review (optional):23779}   Objective    BP 136/86   Pulse 86   Temp 98.4 F (36.9 C) (Oral)   Ht 5' 0.71" (1.542 m)   Wt 207 lb 12.8 oz (94.3 kg)   SpO2 98%   BMI 39.64 kg/m  {Show previous vital signs (optional):23777}  Physical Exam Vitals reviewed.  Constitutional:      General: She is awake.     Appearance: Normal appearance. She is well-developed and well-groomed.  HENT:     Head: Normocephalic and atraumatic.     Right Ear: Hearing, tympanic membrane and ear canal normal.     Left Ear: Hearing, tympanic membrane and ear canal normal.     Nose: Nose normal. No congestion or rhinorrhea.     Mouth/Throat:     Lips: Pink.     Mouth: Mucous membranes are moist. Mucous membranes are pale.     Pharynx: Oropharynx is clear. Uvula midline. No oropharyngeal exudate or posterior  oropharyngeal erythema.     Tonsils: No tonsillar exudate or tonsillar abscesses.  Eyes:     General: Lids are normal. Gaze aligned appropriately.     Extraocular Movements: Extraocular movements intact.     Conjunctiva/sclera: Conjunctivae normal.  Cardiovascular:     Rate and Rhythm: Normal rate and regular rhythm.     Pulses: Normal pulses.          Radial pulses are 2+ on the right side and 2+ on the left side.     Heart sounds: Normal heart sounds. No murmur heard.    No friction rub.  Pulmonary:     Effort: Pulmonary effort is normal.     Breath sounds: Normal breath sounds. No decreased air movement. No decreased breath sounds,  wheezing, rhonchi or rales.  Musculoskeletal:     Cervical back: Normal range of motion and neck supple.     Right lower leg: No edema.     Left lower leg: No edema.  Lymphadenopathy:     Head:     Right side of head: No submental, submandibular or preauricular adenopathy.     Left side of head: No submental, submandibular or preauricular adenopathy.     Cervical:     Right cervical: No superficial or posterior cervical adenopathy.    Left cervical: No superficial or posterior cervical adenopathy.     Upper Body:     Right upper body: No supraclavicular adenopathy.     Left upper body: No supraclavicular adenopathy.     Comments: Mild tenderness along right sternocleidomastoid   Neurological:     Mental Status: She is alert.  Psychiatric:        Behavior: Behavior is cooperative.       No results found for any visits on 03/18/23.  Assessment & Plan      No follow-ups on file.       Problem List Items Addressed This Visit   None Visit Diagnoses     Upper respiratory tract infection, unspecified type    -  Primary Acute, new concern Reports congestion, sore throat,    Relevant Medications   azithromycin (ZITHROMAX) 250 MG tablet        No follow-ups on file.   I, Kerryann Allaire E Zygmund Passero, PA-C, have reviewed all documentation for this visit. The documentation on 03/18/23 for the exam, diagnosis, procedures, and orders are all accurate and complete.   Jacquelin Hawking, MHS, PA-C [facility]

## 2023-03-18 NOTE — Patient Instructions (Addendum)
Based on your described symptoms and the duration of symptoms it is likely that you have a viral upper respiratory infection (often called a "cold")  Symptoms can last for 3-10 days with lingering cough and intermittent symptoms lasting weeks after that.  The goal of treatment at this time is to reduce your symptoms and discomfort   I recommend using Robitussin and Mucinex (regular formulations, nothing with decongestants or DM)  You can also use Tylenol and Ibuprofen for body aches and fever reduction I also recommend adding an antihistamine to your daily regimen This includes medications like Claritin, Allegra, Zyrtec- the generics of these work very well and are usually less expensive I recommend using Flonase nasal spray - 2 puffs twice per day to help with your nasal congestion The antihistamines and Flonase can take a few weeks to provide significant relief from allergy symptoms but should start to provide some benefit soon. You can use a humidifier at night to help with preventing nasal dryness and irritation   If your symptoms do not improve or become worse in the next 5-7 days please make an apt at the office so we can see you  Go to the ER if you begin to have more serious symptoms such as shortness of breath, trouble breathing, loss of consciousness, swelling around the eyes, high fever, severe lasting headaches, vision changes or neck pain/stiffness.   It was nice to meet you and I appreciate the opportunity to be involved in your care If you were satisfied with the care you received from me, I would greatly appreciate you saying so in the after-visit survey that is sent out following our visit.

## 2023-03-22 ENCOUNTER — Telehealth: Payer: BC Managed Care – PPO | Admitting: Nurse Practitioner

## 2023-03-29 ENCOUNTER — Telehealth: Payer: BC Managed Care – PPO | Admitting: Nurse Practitioner

## 2023-10-10 ENCOUNTER — Other Ambulatory Visit: Payer: Self-pay | Admitting: Nurse Practitioner

## 2023-10-11 NOTE — Telephone Encounter (Signed)
Requested Prescriptions  Pending Prescriptions Disp Refills   LARIN 24 FE 1-20 MG-MCG(24) tablet [Pharmacy Med Name: Larin 24 FE 1-20 MG-MCG(24) Oral Tablet] 84 tablet 0    Sig: Take 1 tablet by mouth once daily     OB/GYN:  Contraceptives Passed - 10/11/2023  8:30 AM      Passed - Last BP in normal range    BP Readings from Last 1 Encounters:  03/18/23 136/86         Passed - Valid encounter within last 12 months    Recent Outpatient Visits           6 months ago Upper respiratory tract infection, unspecified type   [place] [facility] [place], Oswaldo Conroy, PA-C   8 months ago Anxiety   [place] [facility] [place], Carver T, NP   1 year ago Elevated low density lipoprotein (LDL) cholesterol level   [place] [facility] [place], Corrie Dandy T, NP   1 year ago Anxiety   [place] [facility] [place], Corrie Dandy T, NP   1 year ago Anxiety    [facility] [place], Jake Church, NP              Passed - Patient is not a smoker

## 2024-01-04 ENCOUNTER — Other Ambulatory Visit: Payer: Self-pay | Admitting: Nurse Practitioner

## 2024-01-05 NOTE — Telephone Encounter (Signed)
 Requested Prescriptions  Pending Prescriptions Disp Refills   LARIN 24 FE 1-20 MG-MCG(24) tablet [Pharmacy Med Name: Larin 24 FE 1-20 MG-MCG(24) Oral Tablet] 84 tablet 0    Sig: Take 1 tablet by mouth once daily     OB/GYN:  Contraceptives Passed - 01/05/2024 11:00 AM      Passed - Last BP in normal range    BP Readings from Last 1 Encounters:  03/18/23 136/86         Passed - Valid encounter within last 12 months    Recent Outpatient Visits           9 months ago Upper respiratory tract infection, unspecified type   [facility] [place], Oswaldo Conroy, PA-C   11 months ago Anxiety   [place] [facility] [place], [place] T, NP   1 year ago Elevated low density lipoprotein (LDL) cholesterol level   [place] [facility] [place], Corrie Dandy T, NP   2 years ago Anxiety   [place] [facility] [place], [place] T, NP   2 years ago Anxiety    [facility] [place], Jake Church, NP              Passed - Patient is not a smoker

## 2024-01-16 LAB — HM MAMMOGRAPHY

## 2024-01-19 ENCOUNTER — Encounter: Payer: Self-pay | Admitting: Family Medicine

## 2024-03-03 ENCOUNTER — Other Ambulatory Visit: Payer: Self-pay | Admitting: Nurse Practitioner

## 2024-03-06 NOTE — Telephone Encounter (Signed)
 Requested medication (s) are due for refill today: yes  Requested medication (s) are on the active medication list: yes  Last refill:  02/07/23 #135 4 RF  Future visit scheduled: no   Notes to clinic:   overdue lab work    Requested Prescriptions  Pending Prescriptions Disp Refills   sertraline  (ZOLOFT ) 50 MG tablet [Pharmacy Med Name: Sertraline  HCl 50 MG Oral Tablet] 135 tablet 0    Sig: TAKE 1 & 1/2 (ONE & ONE-HALF) TABLETS BY MOUTH ONCE DAILY     Psychiatry:  Antidepressants - SSRI - sertraline  Failed - 03/06/2024 10:11 AM      Failed - AST in normal range and within 360 days    AST  Date Value Ref Range Status  08/06/2022 19 0 - 40 IU/L Final   SGOT(AST)  Date Value Ref Range Status  02/13/2015 23 U/L Final    Comment:    15-41 NOTE: New Reference Range  12/24/14          Failed - ALT in normal range and within 360 days    ALT  Date Value Ref Range Status  08/06/2022 19 0 - 32 IU/L Final   SGPT (ALT)  Date Value Ref Range Status  12/15/2013 49 12 - 78 U/L Final         Failed - Completed PHQ-2 or PHQ-9 in the last 360 days      Failed - Valid encounter within last 6 months    Recent Outpatient Visits   None

## 2024-03-09 ENCOUNTER — Other Ambulatory Visit: Payer: Self-pay | Admitting: Nurse Practitioner

## 2024-03-09 NOTE — Telephone Encounter (Unsigned)
 Copied from CRM [id number]. Topic: Clinical - Medication Refill >> Mar 09, 2024  5:45 PM DeAngela L wrote: Medication: sertraline  (ZOLOFT ) 50 MG tablet  Has the patient contacted their pharmacy? Yes  (Agent: If no, request that the patient contact the pharmacy for the refill. If patient does not wish to contact the pharmacy document the reason why and proceed with request.) (Agent: If yes, when and what did the pharmacy advise?)  This is the patient's preferred pharmacy:  [facility] [address]), [place] - [address] [place]) [place] [address] Phone: [phone] Fax: [phone]  Is this the correct pharmacy for this prescription? Yes  If no, delete pharmacy and type the correct one.   Has the prescription been filled recently? Yes   Is the patient out of the medication? Yes  Has the patient been seen for an appointment in the last year OR does the patient have an upcoming appointment? No  Can we respond through MyChart? Yes   Agent: Please be advised that Rx refills may take up to 3 business days. We ask that you follow-up with your pharmacy.

## 2024-03-13 NOTE — Telephone Encounter (Signed)
 Requested medications are due for refill today.  yes   Requested medications are on the active medications list.  yes   Last refill. 02/07/2023 #135 4 rf   Future visit scheduled.   no   Notes to clinic.  Pt is more than 3 months overdue for an office visit. Labs are expired.  Requested Prescriptions  Pending Prescriptions Disp Refills   sertraline  (ZOLOFT ) 50 MG tablet [Pharmacy Med Name: Sertraline  HCl 50 MG Oral Tablet] 135 tablet 0    Sig: TAKE 1 & 1/2 (ONE & ONE-HALF) TABLETS BY MOUTH ONCE DAILY     Psychiatry:  Antidepressants - SSRI - sertraline  Failed - 03/13/2024  3:37 PM      Failed - AST in normal range and within 360 days    AST  Date Value Ref Range Status  08/06/2022 19 0 - 40 IU/L Final   SGOT(AST)  Date Value Ref Range Status  02/13/2015 23 U/L Final    Comment:    15-41 NOTE: New Reference Range  12/24/14          Failed - ALT in normal range and within 360 days    ALT  Date Value Ref Range Status  08/06/2022 19 0 - 32 IU/L Final   SGPT (ALT)  Date Value Ref Range Status  12/15/2013 49 12 - 78 U/L Final         Failed - Completed PHQ-2 or PHQ-9 in the last 360 days      Failed - Valid encounter within last 6 months    Recent Outpatient Visits   None

## 2024-03-13 NOTE — Telephone Encounter (Signed)
 Requested medications are due for refill today.  yes  Requested medications are on the active medications list.  yes  Last refill. 02/07/2023 #135 4 rf  Future visit scheduled.   no  Notes to clinic.  Pt is more than 3 months overdue for an office visit. Labs are expired.    Requested Prescriptions  Pending Prescriptions Disp Refills   sertraline  (ZOLOFT ) 50 MG tablet [Pharmacy Med Name: Sertraline  HCl 50 MG Oral Tablet] 135 tablet 0    Sig: TAKE 1 & 1/2 (ONE & ONE-HALF) TABLETS BY MOUTH ONCE DAILY     Psychiatry:  Antidepressants - SSRI - sertraline  Failed - 03/13/2024  3:35 PM      Failed - AST in normal range and within 360 days    AST  Date Value Ref Range Status  08/06/2022 19 0 - 40 IU/L Final   SGOT(AST)  Date Value Ref Range Status  02/13/2015 23 U/L Final    Comment:    15-41 NOTE: New Reference Range  12/24/14          Failed - ALT in normal range and within 360 days    ALT  Date Value Ref Range Status  08/06/2022 19 0 - 32 IU/L Final   SGPT (ALT)  Date Value Ref Range Status  12/15/2013 49 12 - 78 U/L Final         Failed - Completed PHQ-2 or PHQ-9 in the last 360 days      Failed - Valid encounter within last 6 months    Recent Outpatient Visits   None

## 2024-03-13 NOTE — Telephone Encounter (Signed)
Patient is overdue for an appointment. Please call to schedule and then route to the provider for refill.  

## 2024-03-15 ENCOUNTER — Telehealth: Payer: Self-pay | Admitting: Nurse Practitioner

## 2024-03-15 NOTE — Telephone Encounter (Signed)
 Per chart review, pt is overdue for an appt. Pt has a visit scheduled for tomorrow.

## 2024-03-15 NOTE — Telephone Encounter (Signed)
 Copied from CRM [id number]. Topic: Clinical - Medication Refill >> Mar 15, 2024  8:39 AM Turkey B wrote: Medication: sertraline  (ZOLOFT ) 50 MG tablet  Med was denied for appt, pt now has appt tomorrow  Has the patient contacted their pharmacy? yes (Agent: If yes, when and what did the pharmacy advise?)contact pcp  This is the patient's preferred pharmacy:  [facility] [address] ([address]), [place] - [address] [place]) [place] [address] Phone: [phone] Fax: [phone]  Is this the correct pharmacy for this prescription? yes If no, delete pharmacy and type the correct one.   Has the prescription been filled recently? no  Is the patient out of the medication? yes  Has the patient been seen for an appointment in the last year OR does the patient have an upcoming appointment? yes  Can we respond through MyChart? yes  Agent: Please be advised that Rx refills may take up to 3 business days. We ask that you follow-up with your pharmacy.

## 2024-03-16 ENCOUNTER — Ambulatory Visit: Admitting: Nurse Practitioner

## 2024-03-16 ENCOUNTER — Encounter: Payer: Self-pay | Admitting: Nurse Practitioner

## 2024-03-16 VITALS — BP 112/74 | HR 80 | Temp 98.0°F | Ht 60.8 in | Wt 206.2 lb

## 2024-03-16 DIAGNOSIS — E66812 Obesity, class 2: Secondary | ICD-10-CM | POA: Diagnosis not present

## 2024-03-16 DIAGNOSIS — E6609 Other obesity due to excess calories: Secondary | ICD-10-CM

## 2024-03-16 DIAGNOSIS — Z789 Other specified health status: Secondary | ICD-10-CM

## 2024-03-16 DIAGNOSIS — F419 Anxiety disorder, unspecified: Secondary | ICD-10-CM

## 2024-03-16 DIAGNOSIS — Z6837 Body mass index (BMI) 37.0-37.9, adult: Secondary | ICD-10-CM

## 2024-03-16 DIAGNOSIS — R7309 Other abnormal glucose: Secondary | ICD-10-CM

## 2024-03-16 LAB — PREGNANCY, URINE: Preg Test, Ur: NEGATIVE

## 2024-03-16 MED ORDER — SERTRALINE HCL 50 MG PO TABS: 75.0000 mg | ORAL_TABLET | Freq: Every day | ORAL | 4 refills | Status: AC

## 2024-03-16 MED ORDER — LARIN 24 FE 1-20 MG-MCG(24) PO TABS: 1.0000 | ORAL_TABLET | Freq: Every day | ORAL | 4 refills | Status: AC

## 2024-03-16 NOTE — Progress Notes (Signed)
 BP 112/74   Pulse 80   Temp 98 F (36.7 C) (Oral)   Ht 5' 0.8" (1.544 m)   Wt 206 lb 3.2 oz (93.5 kg)   LMP 02/23/2024 (Approximate)   SpO2 98%   BMI 39.22 kg/m    Subjective:    Patient ID: Shari Mccarthy, female    DOB: 1981-06-12, 43 y.o.   MRN: [id number]  HPI: Shari Mccarthy is a 43 y.o. female  Chief Complaint  Patient presents with   Anxiety   Impaired Fasting Glucose HbA1C:  Lab Results  Component Value Date   HGBA1C 5.8 (H) 08/06/2022  Duration of elevated blood sugar: years Polydipsia: no Polyuria: no Weight change: no Visual disturbance: no Glucose Monitoring: no    Accucheck frequency: Not Checking    Fasting glucose:     Post prandial:  Diabetic Education: Not Completed Family history of diabetes: yes paternal grandfather later in life   ANXIETY/STRESS Continues Sertraline  50 MG daily.  Has been out of medication for 3 days. Duration:stable Anxious mood: yes  Excessive worrying: yes Irritability: only when sees exes things  Sweating: no Nausea: no Palpitations:no Hyperventilation: no Panic attacks: no Agoraphobia: no  Obscessions/compulsions: no Depressed mood: yes    March 20, 2024   10:30 AM 02/07/2023    3:10 PM 08/06/2022    8:55 AM 12/16/2021    4:25 PM 10/27/2021    3:29 PM  Depression screen PHQ 2/9  Decreased Interest 0 0 0 1 1  Down, Depressed, Hopeless 1 1 0 1 1  PHQ - 2 Score 1 1 0 2 2  Altered sleeping 0 0 0 1 0  Tired, decreased energy 1 1 0 1 2  Change in appetite 0 0 0 0 1  Feeling bad or failure about yourself  1 1 0 0 0  Trouble concentrating 0 0 0 0 3  Moving slowly or fidgety/restless 0 0 0 0 0  Suicidal thoughts 0 0 0 0 1  PHQ-9 Score 3 3 0 4 9  Difficult doing work/chores Somewhat difficult Somewhat difficult Not difficult at all Not difficult at all Somewhat difficult  Anhedonia: no Weight changes: no Insomnia: none Hypersomnia: no Fatigue/loss of energy: no Feelings of worthlessness: sometimes Feelings of  guilt: no Impaired concentration/indecisiveness: no Suicidal ideations: no plans, sometimes thinks things are overwhelming but would not hurt self  Crying spells: yes Recent Stressors/Life Changes: yes -- moving and life stressors   Relationship problems: no   Family stress: yes     Financial stress: no    Job stress: yes    Recent death/loss: no     2024-03-20   10:30 AM 02/07/2023    3:12 PM 08/06/2022    8:55 AM 12/16/2021    4:29 PM  GAD 7 : Generalized Anxiety Score  Nervous, Anxious, on Edge 3 1 0 1  Control/stop worrying 1 0 0 1  Worry too much - different things 1 0 0 0  Trouble relaxing 1 0 0 0  Restless 0 0 0 0  Easily annoyed or irritable 1 0 0 0  Afraid - awful might happen 1 1 0 0  Total GAD 7 Score 8 2 0 2  Anxiety Difficulty Somewhat difficult Somewhat difficult Not difficult at all Not difficult at all   Relevant past medical, surgical, family and social history reviewed and updated as indicated. Interim medical history since our last visit reviewed. Allergies and medications reviewed and updated.  Review of Systems  Constitutional:  Negative for activity change, appetite change, diaphoresis, fatigue and fever.  Respiratory:  Negative for cough, chest tightness and shortness of breath.   Cardiovascular:  Negative for chest pain, palpitations and leg swelling.  Gastrointestinal: Negative.   Endocrine: Negative for polydipsia, polyphagia and polyuria.  Neurological: Negative.   Psychiatric/Behavioral:  Negative for decreased concentration, self-injury, sleep disturbance and suicidal ideas. The patient is nervous/anxious.     Per HPI unless specifically indicated above     Objective:     BP 112/74   Pulse 80   Temp 98 F (36.7 C) (Oral)   Ht 5' 0.8" (1.544 m)   Wt 206 lb 3.2 oz (93.5 kg)   LMP 02/23/2024 (Approximate)   SpO2 98%   BMI 39.22 kg/m   Wt Readings from Last 3 Encounters:  03/16/24 206 lb 3.2 oz (93.5 kg)  03/18/23 207 lb 12.8 oz (94.3 kg)   08/06/22 196 lb 14.4 oz (89.3 kg)    Physical Exam Vitals and nursing note reviewed.  Constitutional:      General: She is awake. She is not in acute distress.    Appearance: She is well-developed and well-groomed. She is obese. She is not ill-appearing or toxic-appearing.  HENT:     Head: Normocephalic.     Right Ear: Hearing and external ear normal.     Left Ear: Hearing and external ear normal.  Eyes:     General: Lids are normal.        Right eye: No discharge.        Left eye: No discharge.     Conjunctiva/sclera: Conjunctivae normal.     Pupils: Pupils are equal, round, and reactive to light.  Neck:     Thyroid: No thyromegaly.     Vascular: No carotid bruit.  Cardiovascular:     Rate and Rhythm: Normal rate and regular rhythm.     Heart sounds: Normal heart sounds. No murmur heard.    No gallop.  Pulmonary:     Effort: Pulmonary effort is normal. No accessory muscle usage or respiratory distress.     Breath sounds: Normal breath sounds.  Abdominal:     General: Bowel sounds are normal. There is no distension.     Palpations: Abdomen is soft.     Tenderness: There is no abdominal tenderness.  Musculoskeletal:     Cervical back: Normal range of motion and neck supple.     Right lower leg: No edema.     Left lower leg: No edema.  Lymphadenopathy:     Cervical: No cervical adenopathy.  Skin:    General: Skin is warm and dry.  Neurological:     Mental Status: She is alert and oriented to person, place, and time.     Deep Tendon Reflexes: Reflexes are normal and symmetric.     Reflex Scores:      Brachioradialis reflexes are 2+ on the right side and 2+ on the left side.      Patellar reflexes are 2+ on the right side and 2+ on the left side. Psychiatric:        Attention and Perception: Attention normal.        Mood and Affect: Mood normal.        Speech: Speech normal.        Behavior: Behavior normal. Behavior is cooperative.        Thought Content: Thought  content normal.    Results for orders placed or performed in visit on 01/19/24  HM MAMMOGRAPHY   Collection Time: 01/16/24 10:02 AM  Result Value Ref Range   HM Mammogram 0-4 Bi-Rad 0-4 Bi-Rad, Self Reported Normal      Assessment & Plan:   Problem List Items Addressed This Visit       Other   Uses birth control   Ongoing.  Refills sent in and check urine pregnancy and CMP today.      Relevant Orders   Comprehensive metabolic panel with GFR   Pregnancy, urine   Obesity   BMI 39.22.  Recommended eating smaller high protein, low fat meals more frequently and exercising 30 mins a day 5 times a week with a goal of 10-15lb weight loss in the next 3 months. Patient voiced their understanding and motivation to adhere to these recommendations. LABS: CBC, CMP, TSH today.       Elevated hemoglobin A1c measurement   Noted on past labs -- recheck A1c today and continue focus on diet changes and exercise.      Relevant Orders   Comprehensive metabolic panel with GFR   HgB Z6X   Anxiety - Primary   Chronic, ongoing with current stressors  Denies SI/HI. Continue Zoloft  to 75 MG daily at this time, adjust dosing as needed.  Refills sent in.  If too much anhedonia or numbness with this will reduce back down to 50 MG dosing.  Labs today.      Relevant Medications   sertraline  (ZOLOFT ) 50 MG tablet   Other Relevant Orders   CBC with Differential/Platelet   TSH     Follow up plan: Return in about 6 months (around 09/16/2024) for ANXIETY.

## 2024-03-16 NOTE — Patient Instructions (Signed)
 Managing Depression, Adult Depression is a mental health condition that affects your thoughts, feelings, and actions. Being diagnosed with depression can bring you relief if you did not know why you have felt or behaved a certain way. It could also leave you feeling overwhelmed. Finding ways to manage your symptoms can help you feel more positive about your future. How to manage lifestyle changes Being depressed is difficult. Depression can increase the level of everyday stress. Stress can make depression symptoms worse. You may believe your symptoms cannot be managed or will never improve. However, there are many things you can try to help manage your symptoms. There is hope. Managing stress  Stress is your body's reaction to life changes and events, both good and bad. Stress can add to your feelings of depression. Learning to manage your stress can help lessen your feelings of depression. Try some of the following approaches to reducing your stress (stress reduction techniques): Listen to music that you enjoy and that inspires you. Try using a meditation app or take a meditation class. Develop a practice that helps you connect with your spiritual self. Walk in nature, pray, or go to a place of worship. Practice deep breathing. To do this, inhale slowly through your nose. Pause at the top of your inhale for a few seconds and then exhale slowly, letting yourself relax. Repeat this three or four times. Practice yoga to help relax and work your muscles. Choose a stress reduction technique that works for you. These techniques take time and practice to develop. Set aside 5-15 minutes a day to do them. Therapists can offer training in these techniques. Do these things to help manage stress: Keep a journal. Know your limits. Set healthy boundaries for yourself and others, such as saying "no" when you think something is too much. Pay attention to how you react to certain situations. You may not be able to  control everything, but you can change your reaction. Add humor to your life by watching funny movies or shows. Make time for activities that you enjoy and that relax you. Spend less time using electronics, especially at night before bed. The light from screens can make your brain think it is time to get up rather than go to bed.  Medicines Medicines, such as antidepressants, are often a part of treatment for depression. Talk with your pharmacist or health care provider about all the medicines, supplements, and herbal products that you take, their possible side effects, and what medicines and other products are safe to take together. Make sure to report any side effects you may have to your health care provider. Relationships Your health care provider may suggest family therapy, couples therapy, or individual therapy as part of your treatment. How to recognize changes Everyone responds differently to treatment for depression. As you recover from depression, you may start to: Have more interest in doing activities. Feel more hopeful. Have more energy. Eat a more regular amount of food. Have better mental focus. It is important to recognize if your depression is not getting better or is getting worse. The symptoms you had in the beginning may return, such as: Feeling tired. Eating too much or too little. Sleeping too much or too little. Feeling restless, agitated, or hopeless. Trouble focusing or making decisions. Having unexplained aches and pains. Feeling irritable, angry, or aggressive. If you or your family members notice these symptoms coming back, let your health care provider know right away. Follow these instructions at home: Activity Try to  get some form of exercise each day, such as walking. Try yoga, mindfulness, or other stress reduction techniques. Participate in group activities if you are able. Lifestyle Get enough sleep. Cut down on or stop using caffeine, tobacco,  alcohol, and any other harmful substances. Eat a healthy diet that includes plenty of vegetables, fruits, whole grains, low-fat dairy products, and lean protein. Limit foods that are high in solid fats, added sugar, or salt (sodium). General instructions Take over-the-counter and prescription medicines only as told by your health care provider. Keep all follow-up visits. It is important for your health care provider to check on your mood, behavior, and medicines. Your health care provider may need to make changes to your treatment. Where to find support Talking to others  Friends and family members can be sources of support and guidance. Talk to trusted friends or family members about your condition. Explain your symptoms and let them know that you are working with a health care provider to treat your depression. Tell friends and family how they can help. Finances Find mental health providers that fit with your financial situation. Talk with your health care provider if you are worried about access to food, housing, or medicine. Call your insurance company to learn about your co-pays and prescription plan. Where to find more information You can find support in your area from: Anxiety and Depression Association of America (ADAA): adaa.org Mental Health America: mentalhealthamerica.net The [employer] on Mental Illness: nami.org Contact a health care provider if: You stop taking your antidepressant medicines, and you have any of these symptoms: Nausea. Headache. Light-headedness. Chills and body aches. Not being able to sleep (insomnia). You or your friends and family think your depression is getting worse. Get help right away if: You have thoughts of hurting yourself or others. Get help right away if you feel like you may hurt yourself or others, or have thoughts about taking your own life. Go to your nearest emergency room or: Call 911. Call the National Suicide Prevention Lifeline at  360-042-0264 or 988. This is open 24 hours a day. Text the Crisis Text Line at 952-302-5572. This information is not intended to replace advice given to you by your health care provider. Make sure you discuss any questions you have with your health care provider. Document Revised: 02/09/2022 Document Reviewed: 02/09/2022 Elsevier Patient Education  2024 [employer].

## 2024-03-16 NOTE — Assessment & Plan Note (Signed)
 Chronic, ongoing with current stressors  Denies SI/HI. Continue Zoloft  to 75 MG daily at this time, adjust dosing as needed.  Refills sent in.  If too much anhedonia or numbness with this will reduce back down to 50 MG dosing.  Labs today.

## 2024-03-16 NOTE — Assessment & Plan Note (Signed)
Ongoing.  Refills sent in and check urine pregnancy and CMP today.

## 2024-03-16 NOTE — Assessment & Plan Note (Signed)
 Noted on past labs -- recheck A1c today and continue focus on diet changes and exercise.

## 2024-03-16 NOTE — Assessment & Plan Note (Signed)
 BMI 39.22.  Recommended eating smaller high protein, low fat meals more frequently and exercising 30 mins a day 5 times a week with a goal of 10-15lb weight loss in the next 3 months. Patient voiced their understanding and motivation to adhere to these recommendations. LABS: CBC, CMP, TSH today.

## 2024-03-17 ENCOUNTER — Ambulatory Visit: Payer: Self-pay | Admitting: Nurse Practitioner

## 2024-03-17 LAB — COMPREHENSIVE METABOLIC PANEL WITH GFR
ALT: 19 IU/L (ref 0–32)
AST: 16 IU/L (ref 0–40)
Albumin: 4.2 g/dL (ref 3.9–4.9)
Alkaline Phosphatase: 77 IU/L (ref 44–121)
BUN/Creatinine Ratio: 16 (ref 9–23)
BUN: 7 mg/dL (ref 6–24)
Bilirubin Total: 0.2 mg/dL (ref 0.0–1.2)
CO2: 20 mmol/L (ref 20–29)
Calcium: 9.1 mg/dL (ref 8.7–10.2)
Chloride: 103 mmol/L (ref 96–106)
Creatinine, Ser: 0.45 mg/dL — ABNORMAL LOW (ref 0.57–1.00)
Globulin, Total: 2.4 g/dL (ref 1.5–4.5)
Glucose: 131 mg/dL — ABNORMAL HIGH (ref 70–99)
Potassium: 4.2 mmol/L (ref 3.5–5.2)
Sodium: 138 mmol/L (ref 134–144)
Total Protein: 6.6 g/dL (ref 6.0–8.5)
eGFR: 125 mL/min/{1.73_m2} (ref >59)

## 2024-03-17 LAB — CBC WITH DIFFERENTIAL/PLATELET
Basophils Absolute: 0.1 10*3/uL (ref 0.0–0.2)
Basos: 1 %
EOS (ABSOLUTE): 0.2 10*3/uL (ref 0.0–0.4)
Eos: 2 %
Hematocrit: 37.7 % (ref 34.0–46.6)
Hemoglobin: 12.3 g/dL (ref 11.1–15.9)
Immature Grans (Abs): 0 10*3/uL (ref 0.0–0.1)
Immature Granulocytes: 0 %
Lymphocytes Absolute: 3.5 10*3/uL — ABNORMAL HIGH (ref 0.7–3.1)
Lymphs: 43 %
MCH: 30.8 pg (ref 26.6–33.0)
MCHC: 32.6 g/dL (ref 31.5–35.7)
MCV: 95 fL (ref 79–97)
Monocytes Absolute: 0.4 10*3/uL (ref 0.1–0.9)
Monocytes: 5 %
Neutrophils Absolute: 4 10*3/uL (ref 1.4–7.0)
Neutrophils: 49 %
Platelets: 418 10*3/uL (ref 150–450)
RBC: 3.99 x10E6/uL (ref 3.77–5.28)
RDW: 12.7 % (ref 11.7–15.4)
WBC: 8.1 10*3/uL (ref 3.4–10.8)

## 2024-03-17 LAB — TSH: TSH: 2.51 u[IU]/mL (ref 0.450–4.500)

## 2024-03-17 LAB — HEMOGLOBIN A1C
Est. average glucose Bld gHb Est-mCnc: 128 mg/dL
Hgb A1c MFr Bld: 6.1 % — ABNORMAL HIGH (ref 4.8–5.6)

## 2024-03-17 NOTE — Progress Notes (Signed)
 Contacted via MyChart   Good morning Shari Mccarthy, your labs have returned: - CBC overall stable, no anemia or infection. - Kidney function, creatinine and eGFR, remains normal, as is liver function, AST and ALT.  - Thyroid level normal, TSH. - A1c is trending up to 6.1%.  The A1c is the diabetes testing we talked about, this looks at your blood sugars over the past 3 months and turns the average into a number. Any number 5.7 to 6.4 is considered prediabetes and any number 6.5 or greater is considered diabetes.   I would recommend heavy focus on decreasing foods high in sugar and your intake of things like bread products, pasta, and rice.  The American Diabetes Association online has a large amount of information on diet changes to make.  We will recheck this number at next visit to ensure it is not continuing to trend up.  Any questions? Keep being stellar!!  Thank you for allowing me to participate in your care.  I appreciate you. Kindest regards, Vonna Brabson

## 2024-06-27 ENCOUNTER — Telehealth (INDEPENDENT_AMBULATORY_CARE_PROVIDER_SITE_OTHER): Admitting: Nurse Practitioner

## 2024-06-27 ENCOUNTER — Encounter: Payer: Self-pay | Admitting: Nurse Practitioner

## 2024-06-27 DIAGNOSIS — N951 Menopausal and female climacteric states: Secondary | ICD-10-CM | POA: Diagnosis not present

## 2024-06-27 MED ORDER — GABAPENTIN 100 MG PO CAPS: 100.0000 mg | ORAL_CAPSULE | Freq: Every day | ORAL | 3 refills | Status: DC

## 2024-06-27 NOTE — Assessment & Plan Note (Signed)
 Ongoing for months with worsening.  Multiple symptoms.  Will maintain her Zoloft  and BCP which can offer some benefit.  Add on Gabapentin  100 MG every night for night sweats, sleep, and hot flashes.  Educated her on this medication use and side effects to report.  Will plan on follow-up in 4 weeks and will adjust dose as needed.

## 2024-06-27 NOTE — Progress Notes (Signed)
 There were no vitals taken for this visit.   Subjective:    Patient ID: Shari Mccarthy, female    DOB: 1981/02/19, 43 y.o.   MRN: [id number]  HPI: Shari Mccarthy is a 43 y.o. female  Chief Complaint  Patient presents with   perimenopause    Virtual Visit via Video Note  I connected with Shari Mccarthy on 06/27/24 at  4:20 PM EDT by a video enabled telemedicine application and verified that I am speaking with the correct person using two identifiers.  Location: Patient: home Provider: work   I discussed the limitations of evaluation and management by telemedicine and the availability of in person appointments. The patient expressed understanding and agreed to proceed.  I discussed the assessment and treatment plan with the patient. The patient was provided an opportunity to ask questions and all were answered. The patient agreed with the plan and demonstrated an understanding of the instructions.   The patient was advised to call back or seek an in-person evaluation if the symptoms worsen or if the condition fails to improve as anticipated.  I provided 25 minutes of non-face-to-face time during this encounter.   Tayjah Lobdell T Demesha Boorman, NP   MENOPAUSAL SYMPTOMS Started with symptoms in May 2025, had frozen shoulder. Every day thing and continued from there. Next she began to have night sweats after this. Taking Sertraline  currently for anxiety and on Larin  BC. Gravida/Para: 4/3 Duration: uncontrolled Symptom severity: mild Hot flashes: yes Night sweats: yes Sleep disturbances: yes Vaginal dryness: no Dyspareunia:no Decreased libido: no Emotional lability: yes  Stress incontinence: no Previous HRT/pharmacotherapy: yes, on BCP Hysterectomy: no Average interval between menses: 28 days Length of menses: 4-5 days, shorter than in past which was 7-8 days Flow: all over -- heavy and light Dysmenorrhea: at baseline GYN surgery: none Absolute Contraindications to Hormonal  Therapy:     Undiagnosed vaginal bleeding: no    Breast cancer: yes    Endometrial cancer: no    Coronary disease: no    Cerebrovascular disease: no    Venous thromboembolic disease: no   Relevant past medical, surgical, family and social history reviewed and updated as indicated. Interim medical history since our last visit reviewed. Allergies and medications reviewed and updated.  Review of Systems  Constitutional:  Positive for fatigue. Negative for activity change, appetite change, diaphoresis and fever.  Respiratory:  Negative for cough, chest tightness and shortness of breath.   Cardiovascular:  Negative for chest pain, palpitations and leg swelling.  Gastrointestinal: Negative.   Neurological: Negative.   Psychiatric/Behavioral: Negative.      Per HPI unless specifically indicated above     Objective:    There were no vitals taken for this visit.  Wt Readings from Last 3 Encounters:  03/16/24 206 lb 3.2 oz (93.5 kg)  03/18/23 207 lb 12.8 oz (94.3 kg)  08/06/22 196 lb 14.4 oz (89.3 kg)    Physical Exam Vitals and nursing note reviewed.  Constitutional:      General: She is awake. She is not in acute distress.    Appearance: She is well-developed. She is not ill-appearing.  HENT:     Head: Normocephalic.     Right Ear: Hearing normal.     Left Ear: Hearing normal.  Eyes:     General: Lids are normal.        Right eye: No discharge.        Left eye: No discharge.     Conjunctiva/sclera: Conjunctivae  normal.  Pulmonary:     Effort: Pulmonary effort is normal. No accessory muscle usage or respiratory distress.  Musculoskeletal:     Cervical back: Normal range of motion.  Neurological:     Mental Status: She is alert and oriented to person, place, and time.  Psychiatric:        Attention and Perception: Attention normal.        Mood and Affect: Mood normal.        Behavior: Behavior normal. Behavior is cooperative.        Thought Content: Thought content  normal.        Judgment: Judgment normal.     Results for orders placed or performed in visit on 03/16/24  Pregnancy, urine   Collection Time: 03/16/24 10:55 AM  Result Value Ref Range   Preg Test, Ur Negative Negative  CBC with Differential/Platelet   Collection Time: 03/16/24 10:56 AM  Result Value Ref Range   WBC 8.1 3.4 - 10.8 x10E3/uL   RBC 3.99 3.77 - 5.28 x10E6/uL   Hemoglobin 12.3 11.1 - 15.9 g/dL   Hematocrit 62.2 65.9 - 46.6 %   MCV 95 79 - 97 fL   MCH 30.8 26.6 - 33.0 pg   MCHC 32.6 31.5 - 35.7 g/dL   RDW 87.2 88.2 - 84.5 %   Platelets 418 150 - 450 x10E3/uL   Neutrophils 49 Not Estab. %   Lymphs 43 Not Estab. %   Monocytes 5 Not Estab. %   Eos 2 Not Estab. %   Basos 1 Not Estab. %   Neutrophils Absolute 4.0 1.4 - 7.0 x10E3/uL   Lymphocytes Absolute 3.5 (H) 0.7 - 3.1 x10E3/uL   Monocytes Absolute 0.4 0.1 - 0.9 x10E3/uL   EOS (ABSOLUTE) 0.2 0.0 - 0.4 x10E3/uL   Basophils Absolute 0.1 0.0 - 0.2 x10E3/uL   Immature Granulocytes 0 Not Estab. %   Immature Grans (Abs) 0.0 0.0 - 0.1 x10E3/uL  Comprehensive metabolic panel with GFR   Collection Time: 03/16/24 10:56 AM  Result Value Ref Range   Glucose 131 (H) 70 - 99 mg/dL   BUN 7 6 - 24 mg/dL   Creatinine, Ser 9.54 (L) 0.57 - 1.00 mg/dL   eGFR 874 >40 fO/fpw/8.26   BUN/Creatinine Ratio 16 9 - 23   Sodium 138 134 - 144 mmol/L   Potassium 4.2 3.5 - 5.2 mmol/L   Chloride 103 96 - 106 mmol/L   CO2 20 20 - 29 mmol/L   Calcium  9.1 8.7 - 10.2 mg/dL   Total Protein 6.6 6.0 - 8.5 g/dL   Albumin 4.2 3.9 - 4.9 g/dL   Globulin, Total 2.4 1.5 - 4.5 g/dL   Bilirubin Total 0.2 0.0 - 1.2 mg/dL   Alkaline Phosphatase 77 44 - 121 IU/L   AST 16 0 - 40 IU/L   ALT 19 0 - 32 IU/L  TSH   Collection Time: 03/16/24 10:56 AM  Result Value Ref Range   TSH 2.510 0.450 - 4.500 uIU/mL  HgB A1c   Collection Time: 03/16/24 10:56 AM  Result Value Ref Range   Hgb A1c MFr Bld 6.1 (H) 4.8 - 5.6 %   Est. average glucose Bld gHb Est-mCnc  128 mg/dL      Assessment & Plan:   Problem List Items Addressed This Visit       Cardiovascular and Mediastinum   Perimenopausal vasomotor symptoms - Primary   Ongoing for months with worsening.  Multiple symptoms.  Will maintain her Zoloft   and BCP which can offer some benefit.  Add on Gabapentin  100 MG every night for night sweats, sleep, and hot flashes.  Educated her on this medication use and side effects to report.  Will plan on follow-up in 4 weeks and will adjust dose as needed.        Follow up plan: Return in about 4 weeks (around 07/25/2024) for Perimenopause -- started Gabapentin  06/27/24.

## 2024-06-27 NOTE — Patient Instructions (Signed)
 Perimenopause: What to Know Perimenopause is the time in your life when your levels of estrogen start to go down. Estrogen is the female hormone made by your ovaries. Perimenopause can start 2-8 years before menopause. It can cause changes to your menstrual period. During this time, your ovaries may or may not make an egg. In many cases, you can still get pregnant. What are the causes? Perimenopause is a natural change in your homone levels that happens as you get older. What increases the risk? You're more likely to start perimenopause early if: You have an abnormal growth (tumor) of the pituitary gland in your brain. You have a disease that affects your ovaries. You've had certain treatments for cancer. These include: Chemotherapy. Hormone therapy. Radiation therapy on the area between your hips (pelvis). You smoke a lot or drink a lot of alcohol. Other family members have gone through menopause early. What are the signs or symptoms? Symptoms are unique to each person. You may have: Hot flashes. Irregular periods. Night sweats. Changes in how you feel about sex. You may have less of a sex drive or feel more discomfort around your sexuality. Vaginal dryness. Headaches. Mood swings. Other symptoms may include: Depression. This is when you feel sad or hopeless. Trouble sleeping. Memory problems or trouble focusing. Irritability. This means getting annoyed easily. Tiredness. Weight gain. Anxiety. This is feeling worried or nervous. You can also have trouble getting pregnant. How is this diagnosed? You may be diagnosed based on: Your medical history. An exam. Your age. Your history of menstrual periods. Your symptoms. Hormone tests. How is this treated? In some cases, no treatment is needed. Talk with your health care provider about if you should get treated. Treatments may include: Menopausal hormone therapy (MHT). Medicines to treat certain  symptoms. Acupuncture. Vitamin or herbal supplements. Before you start treatment, let your provider know if you or anyone in your family has or has had: Heart disease. Breast cancer. Blood clots. Diabetes. Osteoporosis. Follow these instructions at home: Eating and drinking  Eat a balanced diet. It should include: Fresh fruits and vegetables. Whole grains. Soybeans. Eggs. Lean meat. Low-fat dairy. To help prevent hot flashes, stay away from: Alcohol. Drinks with caffeine in them. Spicy foods. Lifestyle Do not smoke, vape, or use nicotine or tobacco. Get at least 30 minutes of physical activity on 5 or more days each week. Get 7-8 hours of sleep each night. Dress in layers that can be taken off if you have a hot flash. Find ways to manage stress. You may want to try: Deep breathing. Meditation. Writing in a journal. General instructions  Take your medicines only as told. Keep track of your periods. Track: When they happen. How heavy they are. How long they last. How much time passes between periods. Keep track of your symptoms. Track: When they start. How often you have them. How long they last. Use vaginal lubricants or moisturizers. These can help with: Vaginal dryness. Comfort during sex. You can still get pregnant if you're having any periods. Make sure you use birth control if you don't want to get pregnant. Contact a health care provider if: You have a very heavy period or pass blood clots. Your period lasts more than 2 days longer than normal. Your period comes back sooner than 21 days. You bleed after having sex. You have pain during sex. You have pain when you pee. You get very bad headaches. You have trouble with your eyesight. Get help right away if: You  have chest pain. You have trouble breathing. You have trouble talking. You have very bad depression. This information is not intended to replace advice given to you by your health care provider.  Make sure you discuss any questions you have with your health care provider. Document Revised: 06/09/2023 Document Reviewed: 06/09/2023 Elsevier Patient Education  2024 [employer].

## 2024-06-28 NOTE — Progress Notes (Signed)
 Scheduled

## 2024-07-21 NOTE — Patient Instructions (Signed)
 Perimenopause: What to Know Perimenopause is the time in your life when your levels of estrogen start to go down. Estrogen is the female hormone made by your ovaries. Perimenopause can start 2-8 years before menopause. It can cause changes to your menstrual period. During this time, your ovaries may or may not make an egg. In many cases, you can still get pregnant. What are the causes? Perimenopause is a natural change in your homone levels that happens as you get older. What increases the risk? You're more likely to start perimenopause early if: You have an abnormal growth (tumor) of the pituitary gland in your brain. You have a disease that affects your ovaries. You've had certain treatments for cancer. These include: Chemotherapy. Hormone therapy. Radiation therapy on the area between your hips (pelvis). You smoke a lot or drink a lot of alcohol. Other family members have gone through menopause early. What are the signs or symptoms? Symptoms are unique to each person. You may have: Hot flashes. Irregular periods. Night sweats. Changes in how you feel about sex. You may have less of a sex drive or feel more discomfort around your sexuality. Vaginal dryness. Headaches. Mood swings. Other symptoms may include: Depression. This is when you feel sad or hopeless. Trouble sleeping. Memory problems or trouble focusing. Irritability. This means getting annoyed easily. Tiredness. Weight gain. Anxiety. This is feeling worried or nervous. You can also have trouble getting pregnant. How is this diagnosed? You may be diagnosed based on: Your medical history. An exam. Your age. Your history of menstrual periods. Your symptoms. Hormone tests. How is this treated? In some cases, no treatment is needed. Talk with your health care provider about if you should get treated. Treatments may include: Menopausal hormone therapy (MHT). Medicines to treat certain  symptoms. Acupuncture. Vitamin or herbal supplements. Before you start treatment, let your provider know if you or anyone in your family has or has had: Heart disease. Breast cancer. Blood clots. Diabetes. Osteoporosis. Follow these instructions at home: Eating and drinking  Eat a balanced diet. It should include: Fresh fruits and vegetables. Whole grains. Soybeans. Eggs. Lean meat. Low-fat dairy. To help prevent hot flashes, stay away from: Alcohol. Drinks with caffeine in them. Spicy foods. Lifestyle Do not smoke, vape, or use nicotine or tobacco. Get at least 30 minutes of physical activity on 5 or more days each week. Get 7-8 hours of sleep each night. Dress in layers that can be taken off if you have a hot flash. Find ways to manage stress. You may want to try: Deep breathing. Meditation. Writing in a journal. General instructions  Take your medicines only as told. Keep track of your periods. Track: When they happen. How heavy they are. How long they last. How much time passes between periods. Keep track of your symptoms. Track: When they start. How often you have them. How long they last. Use vaginal lubricants or moisturizers. These can help with: Vaginal dryness. Comfort during sex. You can still get pregnant if you're having any periods. Make sure you use birth control if you don't want to get pregnant. Contact a health care provider if: You have a very heavy period or pass blood clots. Your period lasts more than 2 days longer than normal. Your period comes back sooner than 21 days. You bleed after having sex. You have pain during sex. You have pain when you pee. You get very bad headaches. You have trouble with your eyesight. Get help right away if: You  have chest pain. You have trouble breathing. You have trouble talking. You have very bad depression. This information is not intended to replace advice given to you by your health care provider.  Make sure you discuss any questions you have with your health care provider. Document Revised: 06/09/2023 Document Reviewed: 06/09/2023 Elsevier Patient Education  2024 [employer].

## 2024-07-25 ENCOUNTER — Telehealth: Admitting: Nurse Practitioner

## 2024-07-25 ENCOUNTER — Encounter: Payer: Self-pay | Admitting: Nurse Practitioner

## 2024-07-25 DIAGNOSIS — N951 Menopausal and female climacteric states: Secondary | ICD-10-CM | POA: Diagnosis not present

## 2024-07-25 MED ORDER — GABAPENTIN 100 MG PO CAPS: 100.0000 mg | ORAL_CAPSULE | Freq: Every day | ORAL | 3 refills | Status: AC

## 2024-07-25 NOTE — Progress Notes (Signed)
 There were no vitals taken for this visit.   Subjective:    Patient ID: Shari Mccarthy, female    DOB: 13-Jun-1981, 43 y.o.   MRN: [id number]  HPI: Shari Mccarthy is a 43 y.o. female  Chief Complaint  Patient presents with   perimenopause     Does feel the medication is helping.    Virtual Visit via Video Note  I connected with Shari Mccarthy on 07/25/24 at  4:00 PM EDT by a video enabled telemedicine application and verified that I am speaking with the correct person using two identifiers.  Location: Patient: home Provider: work   I discussed the limitations of evaluation and management by telemedicine and the availability of in person appointments. The patient expressed understanding and agreed to proceed.  I discussed the assessment and treatment plan with the patient. The patient was provided an opportunity to ask questions and all were answered. The patient agreed with the plan and demonstrated an understanding of the instructions.   The patient was advised to call back or seek an in-person evaluation if the symptoms worsen or if the condition fails to improve as anticipated.  I provided 25 minutes of non-face-to-face time during this encounter.   Warda Mcqueary T Jyden Kromer, NP   MENOPAUSAL SYMPTOMS Started with symptoms in May 2025. Started on Gabapentin  100 MG at bedtime. This is helping with night sweats and not waking up as often (gets up maybe 1-2 times, but not 5-6 times). Tolerating medication well without side effects. Gravida/Para: 4/3 Duration: uncontrolled Symptom severity: mild Hot flashes: improving Night sweats: improving Sleep disturbances: improving Vaginal dryness: no Dyspareunia:no Decreased libido: no Emotional lability: yes  Stress incontinence: no Previous HRT/pharmacotherapy: yes, on BCP Hysterectomy: no Average interval between menses: 28 days Length of menses: 4-5 days, these are shorter Flow: all over -- heavy and light Dysmenorrhea: at  baseline GYN surgery: none Absolute Contraindications to Hormonal Therapy:     Undiagnosed vaginal bleeding: no    Breast cancer: yes    Endometrial cancer: no    Coronary disease: no    Cerebrovascular disease: no    Venous thromboembolic disease: no   Relevant past medical, surgical, family and social history reviewed and updated as indicated. Interim medical history since our last visit reviewed. Allergies and medications reviewed and updated.  Review of Systems  Constitutional:  Negative for activity change, appetite change, diaphoresis, fatigue and fever.  Respiratory:  Negative for cough, chest tightness and shortness of breath.   Cardiovascular:  Negative for chest pain, palpitations and leg swelling.  Gastrointestinal: Negative.   Neurological: Negative.   Psychiatric/Behavioral: Negative.      Per HPI unless specifically indicated above     Objective:    There were no vitals taken for this visit.  Wt Readings from Last 3 Encounters:  03/16/24 206 lb 3.2 oz (93.5 kg)  03/18/23 207 lb 12.8 oz (94.3 kg)  08/06/22 196 lb 14.4 oz (89.3 kg)    Physical Exam Vitals and nursing note reviewed.  Constitutional:      General: She is awake. She is not in acute distress.    Appearance: She is well-developed. She is not ill-appearing.  HENT:     Head: Normocephalic.     Right Ear: Hearing normal.     Left Ear: Hearing normal.  Eyes:     General: Lids are normal.        Right eye: No discharge.        Left  eye: No discharge.     Conjunctiva/sclera: Conjunctivae normal.  Pulmonary:     Effort: Pulmonary effort is normal. No accessory muscle usage or respiratory distress.  Musculoskeletal:     Cervical back: Normal range of motion.  Neurological:     Mental Status: She is alert and oriented to person, place, and time.  Psychiatric:        Attention and Perception: Attention normal.        Mood and Affect: Mood normal.        Behavior: Behavior normal. Behavior is  cooperative.        Thought Content: Thought content normal.        Judgment: Judgment normal.     Results for orders placed or performed in visit on 03/16/24  Pregnancy, urine   Collection Time: 03/16/24 10:55 AM  Result Value Ref Range   Preg Test, Ur Negative Negative  CBC with Differential/Platelet   Collection Time: 03/16/24 10:56 AM  Result Value Ref Range   WBC 8.1 3.4 - 10.8 x10E3/uL   RBC 3.99 3.77 - 5.28 x10E6/uL   Hemoglobin 12.3 11.1 - 15.9 g/dL   Hematocrit 62.2 65.9 - 46.6 %   MCV 95 79 - 97 fL   MCH 30.8 26.6 - 33.0 pg   MCHC 32.6 31.5 - 35.7 g/dL   RDW 87.2 88.2 - 84.5 %   Platelets 418 150 - 450 x10E3/uL   Neutrophils 49 Not Estab. %   Lymphs 43 Not Estab. %   Monocytes 5 Not Estab. %   Eos 2 Not Estab. %   Basos 1 Not Estab. %   Neutrophils Absolute 4.0 1.4 - 7.0 x10E3/uL   Lymphocytes Absolute 3.5 (H) 0.7 - 3.1 x10E3/uL   Monocytes Absolute 0.4 0.1 - 0.9 x10E3/uL   EOS (ABSOLUTE) 0.2 0.0 - 0.4 x10E3/uL   Basophils Absolute 0.1 0.0 - 0.2 x10E3/uL   Immature Granulocytes 0 Not Estab. %   Immature Grans (Abs) 0.0 0.0 - 0.1 x10E3/uL  Comprehensive metabolic panel with GFR   Collection Time: 03/16/24 10:56 AM  Result Value Ref Range   Glucose 131 (H) 70 - 99 mg/dL   BUN 7 6 - 24 mg/dL   Creatinine, Ser 9.54 (L) 0.57 - 1.00 mg/dL   eGFR 874 >40 fO/fpw/8.26   BUN/Creatinine Ratio 16 9 - 23   Sodium 138 134 - 144 mmol/L   Potassium 4.2 3.5 - 5.2 mmol/L   Chloride 103 96 - 106 mmol/L   CO2 20 20 - 29 mmol/L   Calcium  9.1 8.7 - 10.2 mg/dL   Total Protein 6.6 6.0 - 8.5 g/dL   Albumin 4.2 3.9 - 4.9 g/dL   Globulin, Total 2.4 1.5 - 4.5 g/dL   Bilirubin Total 0.2 0.0 - 1.2 mg/dL   Alkaline Phosphatase 77 44 - 121 IU/L   AST 16 0 - 40 IU/L   ALT 19 0 - 32 IU/L  TSH   Collection Time: 03/16/24 10:56 AM  Result Value Ref Range   TSH 2.510 0.450 - 4.500 uIU/mL  HgB A1c   Collection Time: 03/16/24 10:56 AM  Result Value Ref Range   Hgb A1c MFr Bld 6.1 (H)  4.8 - 5.6 %   Est. average glucose Bld gHb Est-mCnc 128 mg/dL      Assessment & Plan:   Problem List Items Addressed This Visit       Cardiovascular and Mediastinum   Perimenopausal vasomotor symptoms - Primary   Improving with Gabapentin  at  night.  Multiple symptoms now improving.  Will maintain her Zoloft  and BCP which offer some benefit.  Continue Gabapentin  100 MG every night for night sweats, sleep, and hot flashes.  Educated her on this medication use and side effects to report.  Refills sent in.         Follow up plan: Return for as scheduled in December.

## 2024-07-25 NOTE — Assessment & Plan Note (Signed)
 Improving with Gabapentin  at night.  Multiple symptoms now improving.  Will maintain her Zoloft  and BCP which offer some benefit.  Continue Gabapentin  100 MG every night for night sweats, sleep, and hot flashes.  Educated her on this medication use and side effects to report.  Refills sent in.

## 2024-09-14 NOTE — Patient Instructions (Incomplete)
 Be Involved in Caring For Your Health:  Taking Medications When medications are taken as directed, they can greatly improve your health. But if they are not taken as prescribed, they may not work. In some cases, not taking them correctly can be harmful. To help ensure your treatment remains effective and safe, understand your medications and how to take them. Bring your medications to each visit for review by your provider.  Your lab results, notes, and after visit summary will be available on My Chart. We strongly encourage you to use this feature. If lab results are abnormal the clinic will contact you with the appropriate steps. If the clinic does not contact you assume the results are satisfactory. You can always view your results on My Chart. If you have questions regarding your health or results, please contact the clinic during office hours. You can also ask questions on My Chart.  We at [facility] are grateful that you chose [place] to provide your care. We strive to provide evidence-based and compassionate care and are always looking for feedback. If you get a survey from the clinic please complete this so we can hear your opinions.  Prediabetes: Eating Plan Prediabetes is when your levels of blood sugar, also called glucose, are higher than normal. This can put you at risk for getting type 2 diabetes. When you have prediabetes, making healthy changes can help keep you from getting diabetes. This includes changes in your diet. Work with your health care provider or an expert in healthy eating called a dietitian. They can help you create a healthy eating plan. This plan can help you: Control your blood sugar levels. Improve your cholesterol levels. Manage your blood pressure. What are tips for following this plan? Reading food labels Read food labels to check the amount of fat and sugar in prepackaged foods. Avoid foods that have: Saturated fats. Trans fats. Added sugars. Check  food labels for the amount of salt (sodium). Avoid foods that have more than 300 milligrams (mg) of salt per serving. Limit your salt intake to less than 2,300 mg each day. Shopping Avoid buying pre-made and processed foods. Avoid buying drinks with added sugar. Cooking Cook with olive oil. Do not use: Butter. Lard. Ghee. Bake, broil, grill, steam, or boil foods. Avoid frying. Meal planning  Work with your dietitian to create an eating plan that's right for you. This may include tracking how many calories you take in each day. Use a food diary, notebook, or mobile app to track what you eat at each meal. Consider following a Mediterranean diet. This includes: Eating many servings of fresh fruits and vegetables each day. Eating fish at least twice a week. Eating one serving each day of whole grains, beans, nuts, and seeds. Using olive oil instead of other fats. Limiting alcohol. Limiting red meat. Using nonfat or low-fat dairy products. Consider following a plant-based diet. This means eating mostly: Vegetables and fruit. Grains. Beans. Nuts and seeds. If you have high blood pressure, you may need to limit your salt intake or follow a diet called the DASH eating plan. The DASH eating plan can help lower high blood pressure. Lifestyle Set weight loss goals with help from your health care team. Losing 7% of your body weight is a good goal for most people with prediabetes. Exercise for at least 30 minutes, 5 or more days a week. For support, think about joining a support group or talking with a mental health counselor. Take medicines only as  told. What foods are recommended? Fruits Berries. Bananas. Apples. Oranges. Grapes. Papaya. Mango. Pomegranate. Kiwi. Grapefruit. Cherries. Vegetables Lettuce. Spinach. Peas. Beets. Cauliflower. Cabbage. Broccoli. Carrots. Tomatoes. Squash. Eggplant. Herbs. Peppers. Onions. Cucumbers. Brussels sprouts. Grains Whole grains, such as whole-wheat  or whole-grain breads or pasta. Unsweetened oatmeal. Bulgur. Barley. Quinoa. Brown rice. Corn or whole-wheat flour tortillas or taco shells. Meats and other proteins Seafood. Poultry without skin. Lean cuts of pork and beef. Tofu. Eggs. Nuts. Beans. Dairy Low-fat or fat-free dairy products, such as yogurt, cottage cheese, and cheese. Beverages Water. Tea. Coffee. Sugar-free or diet soda. Seltzer water. Low-fat or nonfat milk. Milk alternatives, such as soy or almond milk. Fats and oils Olive oil. Canola oil. Sunflower oil. Grapeseed oil. Avocado. Walnuts. Sweets and desserts Sugar-free or low-fat pudding. Sugar-free or low-fat ice cream and other frozen treats. Seasonings and condiments Herbs. Salt-free spices. Mustard. Relish. Low-salt, low-sugar ketchup. Low-salt, low-sugar barbecue sauce. Low-fat or fat-free mayonnaise. The items listed above may not be all the foods and drinks you can have. Talk with a dietitian to learn more. What foods are not recommended? Fruits Fruits canned with syrup. Vegetables Canned vegetables. Frozen vegetables with butter or cream sauce. Grains Refined white flour and flour products, such as bread, pasta, snack foods, and cereals. Meats and other proteins Fatty cuts of meat. Poultry with skin. Breaded or fried meat. Processed meats. Dairy Full-fat yogurt, cheese, or milk. Beverages Sweetened drinks, such as iced tea and soda. Fats and oils Butter. Lard. Ghee. Sweets and desserts Baked goods, such as cake, cupcakes, pastries, cookies, and cheesecake. Seasonings and condiments Spice mixes with added salt. Ketchup. Barbecue sauce. Mayonnaise. The items listed above may not be all the foods and drinks you should avoid. Talk with a dietitian to learn more. Where to find more information American Diabetes Association: diabetes.org/food-nutrition This information is not intended to replace advice given to you by your health care provider. Make sure you  discuss any questions you have with your health care provider. Document Revised: 05/08/2023 Document Reviewed: 05/08/2023 Elsevier Patient Education  2024 [employer].

## 2024-09-17 ENCOUNTER — Ambulatory Visit: Admitting: Nurse Practitioner

## 2024-09-17 DIAGNOSIS — R7309 Other abnormal glucose: Secondary | ICD-10-CM

## 2024-09-17 DIAGNOSIS — N951 Menopausal and female climacteric states: Secondary | ICD-10-CM

## 2024-09-17 DIAGNOSIS — E6609 Other obesity due to excess calories: Secondary | ICD-10-CM

## 2024-09-17 DIAGNOSIS — F419 Anxiety disorder, unspecified: Secondary | ICD-10-CM
